# Patient Record
Sex: Female | Born: 1955
Health system: Southern US, Community
[De-identification: ages and names within clinical notes are randomized; demographics above are authoritative.]

## PROBLEM LIST (undated history)

## (undated) DIAGNOSIS — I10 Essential (primary) hypertension: Secondary | ICD-10-CM

## (undated) DIAGNOSIS — S46009A Unspecified injury of muscle(s) and tendon(s) of the rotator cuff of unspecified shoulder, initial encounter: Secondary | ICD-10-CM

## (undated) DIAGNOSIS — C801 Malignant (primary) neoplasm, unspecified: Secondary | ICD-10-CM

## (undated) DIAGNOSIS — K219 Gastro-esophageal reflux disease without esophagitis: Secondary | ICD-10-CM

## (undated) DIAGNOSIS — M1990 Unspecified osteoarthritis, unspecified site: Secondary | ICD-10-CM

## (undated) DIAGNOSIS — T7840XA Allergy, unspecified, initial encounter: Secondary | ICD-10-CM

## (undated) HISTORY — PX: JOINT REPLACEMENT: SHX530

## (undated) HISTORY — PX: ABDOMINAL HYSTERECTOMY: SHX81

## (undated) HISTORY — PX: CHOLECYSTECTOMY: SHX55

## (undated) HISTORY — PX: BREAST REDUCTION SURGERY: SHX8

## (undated) HISTORY — DX: Unspecified injury of muscle(s) and tendon(s) of the rotator cuff of unspecified shoulder, initial encounter: S46.009A

## (undated) HISTORY — PX: TUBAL LIGATION: SHX77

## (undated) HISTORY — DX: Unspecified osteoarthritis, unspecified site: M19.90

## (undated) HISTORY — DX: Gastro-esophageal reflux disease without esophagitis: K21.9

## (undated) HISTORY — DX: Allergy, unspecified, initial encounter: T78.40XA

## (undated) HISTORY — PX: FRACTURE SURGERY: SHX138

## (undated) HISTORY — DX: Malignant (primary) neoplasm, unspecified: C80.1

## (undated) HISTORY — PX: BREAST SURGERY: SHX581

## (undated) HISTORY — PX: BREAST LUMPECTOMY: SHX2

---

## 1999-05-14 ENCOUNTER — Other Ambulatory Visit: Admission: RE | Admit: 1999-05-14 | Discharge: 1999-05-14 | Payer: Self-pay | Admitting: Gynecology

## 2001-09-14 ENCOUNTER — Other Ambulatory Visit: Admission: RE | Admit: 2001-09-14 | Discharge: 2001-09-14 | Payer: Self-pay | Admitting: Family Medicine

## 2002-04-24 ENCOUNTER — Encounter: Payer: Self-pay | Admitting: Obstetrics and Gynecology

## 2002-05-01 ENCOUNTER — Observation Stay (HOSPITAL_COMMUNITY): Admission: RE | Admit: 2002-05-01 | Discharge: 2002-05-02 | Payer: Self-pay | Admitting: Obstetrics and Gynecology

## 2002-05-01 ENCOUNTER — Encounter (INDEPENDENT_AMBULATORY_CARE_PROVIDER_SITE_OTHER): Payer: Self-pay

## 2002-11-08 ENCOUNTER — Other Ambulatory Visit: Admission: RE | Admit: 2002-11-08 | Discharge: 2002-11-08 | Payer: Self-pay | Admitting: Obstetrics and Gynecology

## 2003-12-12 ENCOUNTER — Other Ambulatory Visit: Admission: RE | Admit: 2003-12-12 | Discharge: 2003-12-12 | Payer: Self-pay | Admitting: Obstetrics and Gynecology

## 2007-05-12 ENCOUNTER — Encounter: Admission: RE | Admit: 2007-05-12 | Discharge: 2007-05-12 | Payer: Self-pay | Admitting: Family Medicine

## 2007-06-15 DIAGNOSIS — C801 Malignant (primary) neoplasm, unspecified: Secondary | ICD-10-CM

## 2007-06-15 HISTORY — DX: Malignant (primary) neoplasm, unspecified: C80.1

## 2007-12-12 ENCOUNTER — Ambulatory Visit (HOSPITAL_COMMUNITY): Admission: RE | Admit: 2007-12-12 | Discharge: 2007-12-12 | Payer: Self-pay | Admitting: Family Medicine

## 2009-03-19 ENCOUNTER — Encounter (HOSPITAL_COMMUNITY): Admission: RE | Admit: 2009-03-19 | Discharge: 2009-04-18 | Payer: Self-pay

## 2009-04-19 ENCOUNTER — Encounter (HOSPITAL_COMMUNITY): Admission: RE | Admit: 2009-04-19 | Discharge: 2009-05-19 | Payer: Self-pay | Admitting: Internal Medicine

## 2010-04-29 ENCOUNTER — Encounter
Admission: RE | Admit: 2010-04-29 | Discharge: 2010-05-21 | Payer: Self-pay | Source: Home / Self Care | Attending: Internal Medicine | Admitting: Internal Medicine

## 2010-06-09 ENCOUNTER — Emergency Department (HOSPITAL_COMMUNITY)
Admission: EM | Admit: 2010-06-09 | Discharge: 2010-06-09 | Payer: Self-pay | Source: Home / Self Care | Admitting: Emergency Medicine

## 2010-07-05 ENCOUNTER — Encounter: Payer: Self-pay | Admitting: Family Medicine

## 2010-07-06 ENCOUNTER — Encounter: Payer: Self-pay | Admitting: Obstetrics and Gynecology

## 2010-10-30 NOTE — Op Note (Signed)
Erin Hurley, Erin Hurley                        ACCOUNT NO.:  [id number]   MEDICAL RECORD NO.:  [id number]                   PATIENT TYPE:  INP   LOCATION:  X004                                 FACILITY:  [facility]   PHYSICIAN:  Duke Salvia. Marcelle Hurley, M.D.            DATE OF BIRTH:  1956/04/30   DATE OF PROCEDURE:  05/01/2002  DATE OF DISCHARGE:                                 OPERATIVE REPORT   PREOPERATIVE DIAGNOSES:  Cystocele, pelvic pain, menorrhagia with leiomyoma,  stress urinary incontinence.   POSTOPERATIVE DIAGNOSES:  Cystocele, pelvic pain, menorrhagia with  leiomyoma, stress urinary incontinence.   PROCEDURE:  Laparoscopically assisted vaginal hysterectomy, anterior repair,  pubovaginal sling per Dr. Annabell Howells.   SURGEON:  Duke Salvia. Marcelle Hurley, M.D.   ASSISTANT:  Juluis Mire, M.D.   ANESTHESIA:  General endotracheal.   SPECIMENS:  Uterus and vaginal mucosa.   ESTIMATED BLOOD LOSS:  500   DESCRIPTION OF PROCEDURE:  The patient was taken to the operating room and  after an adequate level of general endotracheal anesthesia was obtained, the  patient was placed in stirrups, the abdomen, perineum, and vagina prepped  and draped in the usual manner for laparoscopic vaginal procedures. The  bladder was drained, EUA carried out, the uterus in position, normal size,  adnexa negative. A Hulka tenaculum was positioned after the bladder was  drained. Attention was directed to the abdomen where a 2 cm subumbilical  incision was made, the Veress needle was introduced without difficulty and  it's intra-abdominal position was verified by pressure and water testing.  After 2 liter pneumoperitoneum was then created, laparoscopic trocars were  then introduced. There was no evidence of any bleeding or trauma. Three  fingerbreadths below the symphysis on the midline, a 5 mm trocar was  inserted without difficulty. The uterus itself was upper limit of normal  size. There was solitary omental  adhesion to the area of the right mid tube  which was coagulated and cut. There was on bile this area. Both ovaries were  unremarkable. The previously noted right adnexal cysts have resolved  representing a transient corpus luteum cyst. The tripolar coag cutting  instrument was used to coagulate the uteroovarian pedicles including the  round ligament on either side with excellent hemostasis. After this was  completed and the vaginal portion of the procedure was started, the  patient's legs were extended, a weighted retractor positioned, cervix  grasped with a tenaculum, cervicovaginal mucosa was incised, posterior  culdotomy performed without difficulty. The bladder was advanced superiorly  with sharp and blunt dissection. The LigaSure system was used then to clamp,  coagulate and cut the uterosacral ligaments on either side. The bladder was  advanced superiorly until the peritoneum could be identified. This was  entered sharply and the retractor was used to gently position the bladder  out of the field. In sequential manner then, the cardinal ligament, uterine  vasculature, pedicle and  upper broad ligament pedicles were clamped,  coagulated and cut. The fundus of the uterus was then delivered posteriorly  and the remaining upper broad ligament pedicle was clamped and the specimen  was released. These pedicles were free tied followed by suture ligature of#0  Dexon. The cuff was then closed with running 2-0 Dexon locked suture. All  major pedicles were inspected and noted to be hemostatic. McCall culdoplasty  suture placed from 3 to 9 o'clock from the left uterosacral ligaments  picking up the posterior peritoneum across to the right side and tied down.  The cuff was closed from right to left with figure-of-eight 2-0 Monocryl  sutures. Prior to this, sponge, needle and instrument counts had been  reported as correct x2. Just above the cuff, the anterior mucosa was grasped  with Allis clamps,  divided in the midline up to the UV angle. The  paravesical fascia was then separated from underneath and plicating sutures  of 2-0 Dexon from the UV angle posterior were positioned reducing the  cystocele, excess vaginal mucosa was trimmed and this was closed with  running 2-0 Monocryl sutures. The upper portion was left open for Dr. Annabell Howells  to perform the pubovaginal sling which was dictated separately. The catheter  was positioned after this, repeat laparoscopy was performed revealing  hemostasis. Instruments were removed, gas allowed to escape, mucosa closed  with 4-0 Dexon subcuticular sutures, Dermabond and Steri-Strips. She went to  the recovery room in good condition.                                               Erin Hurley, M.D.    RMH/MEDQ  D:  05/01/2002  T:  05/01/2002  Job:  [id number]

## 2010-10-30 NOTE — H&P (Signed)
Erin Hurley, Erin Hurley                        ACCOUNT NO.:  [id number]   MEDICAL RECORD NO.:  [id number]                   PATIENT TYPE:  INP   LOCATION:  X004                                 FACILITY:  [facility]   PHYSICIAN:  Duke Salvia. Marcelle Overlie, M.D.            DATE OF BIRTH:  09/03/1955   DATE OF ADMISSION:  05/01/2002  DATE OF DISCHARGE:                                HISTORY & PHYSICAL   CHIEF COMPLAINT:  Stress urinary incontinence, pelvic pressure, leiomyoma  and cystocele.   HISTORY OF PRESENT ILLNESS:  A 55 year old, G2, P2 patient with a prior  tubal was referred to me by Dr. Bjorn Pippin in October. He had evaluated her  for incontinence and I have recommended a sling. When seen in our office,  she was complaining about increased pelvic pressure and the sensation that  something was falling out. On my exam, the uterus was 6-8 week size and was  irregular. Exam consistent with 6-8 week leiomyoma.   She underwent an ultrasound in our office that showed uterine size 6 1/2 x  11 x 5 1/2 with possibility of polyps and also small leiomyoma. There was a  3.7 x 3.6 x 3.3 cm ovarian mass noted at that time possibly representing a  complex corpus luteum cyst or an endometrioma. No free fluid noted. Due to  her complaint secondary to the pressure from the leiomyoma and pelvic pain,  she presents now for LAVH, possible oophorectomy, cystocele repair and sling  for Dr. Annabell Howells. This procedure including risk of bleeding, infection,  transfusion, adjacent organ injury, the possible need for open or additional  surgery are all reviewed with her. Along with her expected recovery time.   PAST MEDICAL HISTORY:  Allergies none.   OBSTETRICAL HISTORY:  Two vaginal deliveries without complications.   PAST SURGICAL HISTORY:  Prior tubal ligation.   REVIEW OF SYMPTOMS:  Otherwise unremarkable.   FAMILY HISTORY:  Mother and father both had a history of heart disease and  father with  hypertension.   SOCIAL HISTORY:  She denies smoking or alcohol use.   PHYSICAL EXAMINATION:  VITAL SIGNS:  Temperature 98.2, blood pressure  120/82.   HEENT:  Unremarkable.   NECK:  Supple without masses.   LUNGS:  Clear.   CARDIOVASCULAR:  Regular rate and rhythm without murmurs, rubs or gallops.   BREASTS:  Without masses.   ABDOMEN:  Soft, flat, nontender.   PELVIC:  Normal external genitalia. Vagina and cervix clear. Uterus was 6  weeks size, slightly irregular at the fundus. Adnexa negative. On straining,  there was mild descensus and a cystocele was noted. Her posterior support  was good.   EXTREMITIES:  Unremarkable.   NEUROLOGIC:  Unremarkable.   IMPRESSION:  1. Symptomatic leiomyoma.  2. History of pelvic pain with ultrasound showing possible endometrioma     versus corpus luteum cyst.  3. Stress urinary incontinence.   PLAN:  Laparoscopically assisted vaginal hysterectomy with possible  oophorectomy, anterior colporrhaphy and pubovaginal sling per Dr. Annabell Howells. The  procedure and risks were reviewed as above.                                               Richard M. Marcelle Overlie, M.D.    RMH/MEDQ  D:  05/01/2002  T:  05/01/2002  Job:  [id number]

## 2011-04-03 DIAGNOSIS — Z96642 Presence of left artificial hip joint: Secondary | ICD-10-CM | POA: Insufficient documentation

## 2011-04-03 DIAGNOSIS — M169 Osteoarthritis of hip, unspecified: Secondary | ICD-10-CM | POA: Insufficient documentation

## 2011-04-03 DIAGNOSIS — C50919 Malignant neoplasm of unspecified site of unspecified female breast: Secondary | ICD-10-CM | POA: Insufficient documentation

## 2011-04-03 DIAGNOSIS — K219 Gastro-esophageal reflux disease without esophagitis: Secondary | ICD-10-CM | POA: Insufficient documentation

## 2011-04-03 DIAGNOSIS — B351 Tinea unguium: Secondary | ICD-10-CM | POA: Insufficient documentation

## 2011-04-09 DIAGNOSIS — M8580 Other specified disorders of bone density and structure, unspecified site: Secondary | ICD-10-CM | POA: Insufficient documentation

## 2011-08-06 DIAGNOSIS — Z9889 Other specified postprocedural states: Secondary | ICD-10-CM | POA: Insufficient documentation

## 2014-01-21 ENCOUNTER — Encounter: Payer: Self-pay | Admitting: Nurse Practitioner

## 2014-01-21 ENCOUNTER — Ambulatory Visit (INDEPENDENT_AMBULATORY_CARE_PROVIDER_SITE_OTHER): Payer: BC Managed Care – PPO | Admitting: Nurse Practitioner

## 2014-01-21 ENCOUNTER — Encounter (INDEPENDENT_AMBULATORY_CARE_PROVIDER_SITE_OTHER): Payer: Self-pay

## 2014-01-21 VITALS — BP 135/76 | HR 117 | Temp 100.2°F | Ht 65.0 in | Wt 207.0 lb

## 2014-01-21 DIAGNOSIS — L0233 Carbuncle of buttock: Secondary | ICD-10-CM

## 2014-01-21 MED ORDER — CEFTRIAXONE SODIUM 1 G IJ SOLR
1.0000 g | Freq: Once | INTRAMUSCULAR | Status: AC
  Administered 2014-01-21: 1 g via INTRAMUSCULAR

## 2014-01-21 MED ORDER — SULFAMETHOXAZOLE-TMP DS 800-160 MG PO TABS: 1.0000 | ORAL_TABLET | Freq: Two times a day (BID) | ORAL | Status: DC

## 2014-01-21 NOTE — Progress Notes (Signed)
   Subjective:    Patient ID: Erin Hurley, female    DOB: 1955-06-29, 58 y.o.   MRN: [id number]  HPI Patient in c/o boil in groin area. Found it on Saturday and has gotten worse- drained some last night.    Review of Systems  Respiratory: Negative.   Cardiovascular: Negative.   Genitourinary: Negative.   Neurological: Negative.   Psychiatric/Behavioral: Negative.   All other systems reviewed and are negative.      Objective:   Physical Exam  Constitutional: She is oriented to person, place, and time. She appears well-developed and well-nourished.  Cardiovascular: Normal rate, regular rhythm and normal heart sounds.   Pulmonary/Chest: Effort normal and breath sounds normal.  Neurological: She is alert and oriented to person, place, and time.  Skin: Skin is warm and dry.  10 cm erythematous indurated area on left perineum and buttocks.  Psychiatric: She has a normal mood and affect. Her behavior is normal. Judgment and thought content normal.   BP 135/76  Pulse 117  Temp(Src) 100.2 F (37.9 C) (Oral)  Ht 5\' 5"  (1.651 m)  Wt 207 lb (93.895 kg)  BMI 34.45 kg/m2        Assessment & Plan:   1. Carbuncle of buttock    Meds ordered this encounter  Medications  . letrozole (FEMARA) 2.5 MG tablet    Sig: Take 2.5 mg by mouth daily.  . Vitamin D, Ergocalciferol, (DRISDOL) 50000 UNITS CAPS capsule    Sig: Take 50,000 Units by mouth every 7 (seven) days.   Warm soaks If starts draining and can squeeze pus out that is fine If not draining and clearing up by Electra, FNP

## 2014-01-21 NOTE — Patient Instructions (Signed)

## 2014-07-31 ENCOUNTER — Ambulatory Visit: Payer: Self-pay | Admitting: Family

## 2014-08-09 ENCOUNTER — Other Ambulatory Visit: Payer: Self-pay | Admitting: Physician Assistant

## 2014-08-09 ENCOUNTER — Encounter: Payer: Self-pay | Admitting: Physician Assistant

## 2014-08-09 ENCOUNTER — Encounter (INDEPENDENT_AMBULATORY_CARE_PROVIDER_SITE_OTHER): Payer: Self-pay

## 2014-08-09 ENCOUNTER — Ambulatory Visit (INDEPENDENT_AMBULATORY_CARE_PROVIDER_SITE_OTHER): Payer: BLUE CROSS/BLUE SHIELD | Admitting: Physician Assistant

## 2014-08-09 ENCOUNTER — Ambulatory Visit (INDEPENDENT_AMBULATORY_CARE_PROVIDER_SITE_OTHER): Payer: BLUE CROSS/BLUE SHIELD

## 2014-08-09 ENCOUNTER — Ambulatory Visit: Admission: RE | Admit: 2014-08-09 | Payer: Self-pay | Source: Ambulatory Visit

## 2014-08-09 VITALS — BP 160/100 | HR 102 | Temp 98.6°F | Ht 65.0 in | Wt 206.0 lb

## 2014-08-09 DIAGNOSIS — M25561 Pain in right knee: Secondary | ICD-10-CM

## 2014-08-09 DIAGNOSIS — R52 Pain, unspecified: Secondary | ICD-10-CM

## 2014-08-09 MED ORDER — MELOXICAM 7.5 MG PO TABS: 7.5000 mg | ORAL_TABLET | Freq: Every day | ORAL | Status: DC

## 2014-08-09 NOTE — Progress Notes (Signed)
Subjective:     Patient ID: Erin Hurley, female   DOB: Oct 20, 1955, 59 y.o.   MRN: [id number]  HPI 59 y/o female presents with c/o right knee pain x 2 weeks that began suddenly when she sat down and tucked her knee under her other leg in the chair. She does not recall popping but had immediate pain in the knee and surrounding area. Has been taking Aleve , 2 tabs BID with mild relief.   Review of Systems  Musculoskeletal: Positive for joint swelling and arthralgias (right knee).  Neurological: Negative for weakness and numbness.       Objective:   Physical Exam  Musculoskeletal: Normal range of motion. She exhibits edema (RLE, localized to knee and surrounding area) and tenderness (mild TTP medial patella).  Skin: No erythema.  Palpable inflammation posterior right knee resembling Bakers Cyst       Assessment:    1. Right knee pain - xray negative for fracture or dislocation 2. Bakers Cyst secondary to knee inflammation/injury     Plan:    Rx Mobic 7.5 mg q day x 7 days. Increase to two tablets q day if no improvement after 1 week. Use knee brace for compression. Rest, Ice and elevate. F/u in 2-3 weeks for reassessment and will order MRI if no improvement.

## 2014-08-09 NOTE — Patient Instructions (Signed)
Baker Cyst A Baker cyst is a sac-like structure that forms in the back of the knee. It is filled with the same fluid that is located in your knee. This fluid lubricates the bones and cartilage of the knee and allows them to move over each other more easily. CAUSES  When the knee becomes injured or inflamed, increased fluid forms in the knee. When this happens, the joint lining is pushed out behind the knee and forms the Baker cyst. This cyst may also be caused by inflammation from arthritic conditions and infections. SIGNS AND SYMPTOMS  A Baker cyst usually has no symptoms. When the cyst is substantially enlarged:  You may feel pressure behind the knee, stiffness in the knee, or a mass in the area behind the knee.  You may develop pain, redness, and swelling in the calf. This can suggest a blood clot and requires evaluation by your health care provider. DIAGNOSIS  A Baker cyst is most often found during an ultrasound exam. This exam may have been performed for other reasons, and the cyst was found incidentally. Sometimes an MRI is used. This picks up other problems within a joint that an ultrasound exam may not. If the Baker cyst developed immediately after an injury, X-ray exams may be used to diagnose the cyst. TREATMENT  The treatment depends on the cause of the cyst. Anti-inflammatory medicines and rest often will be prescribed. If the cyst is caused by a bacterial infection, antibiotic medicines may be prescribed.  HOME CARE INSTRUCTIONS   If the cyst was caused by an injury, for the first 24 hours, keep the injured leg elevated on 2 pillows while lying down.  For the first 24 hours while you are awake, apply ice to the injured area:  Put ice in a plastic bag.  Place a towel between your skin and the bag.  Leave the ice on for 20 minutes, 2-3 times a day.  Only take over-the-counter or prescription medicines for pain, discomfort, or fever as directed by your health care  provider.  Only take antibiotic medicine as directed. Make sure to finish it even if you start to feel better. MAKE SURE YOU:   Understand these instructions.  Will watch your condition.  Will get help right away if you are not doing well or get worse. Document Released: 05/31/2005 Document Revised: 03/21/2013 Document Reviewed: 01/10/2013 [facility] Patient Information 2015 [place], [place]. This information is not intended to replace advice given to you by your health care provider. Make sure you discuss any questions you have with your health care provider. Knee Bracing Knee braces are supports to help stabilize and protect an injured or painful knee. They come in many different styles. They should support and protect the knee without increasing the chance of other injuries to yourself or others. It is important not to have a false sense of security when using a brace. Knee braces that help you to keep using your knee:  Do not restore normal knee stability under high stress forces.  May decrease some aspects of athletic performance. Some of the different types of knee braces are:  Prophylactic knee braces are designed to prevent or reduce the severity of knee injuries during sports that make injury to the knee more likely.  Rehabilitative knee braces are designed to allow protected motion of:  Injured knees.  Knees that have been treated with or without surgery. There is no evidence that the use of a supportive knee brace protects the graft following  a successful anterior cruciate ligament (ACL) reconstruction. However, braces are sometimes used to:   Protect injured ligaments.  Control knee movement during the initial healing period. They may be used as part of the treatment program for the various injured ligaments or cartilage of the knee including the:  Anterior cruciate ligament.  Medial collateral ligament.  Medial or lateral cartilage (meniscus).  Posterior cruciate  ligament.  Lateral collateral ligament. Rehabilitative knee braces are most commonly used:  During crutch-assisted walking right after injury.  During crutch-assisted walking right after surgery to repair the cartilage and/or cruciate ligament injury.  For a short period of time, 2-8 weeks, after the injury or surgery. The value of a rehabilitative brace as opposed to a cast or splint includes the:  Ability to adjust the brace for swelling.  Ability to remove the brace for examinations, icing, or showering.  Ability to allow for movement in a controlled range of motion. Functional knee braces give support to knees that have already been injured. They are designed to provide stability for the injured knee and provide protection after repair. Functional knee braces may not affect performance much. Lower extremity muscle strengthening, flexibility, and improvement in technique are more important than bracing in treating ligamentous knee injuries. Functional braces are not a substitute for rehabilitation or surgical procedures. Unloader/off-loader braces are designed to provide pain relief in arthritic knees. Patients with wear and tear arthritis from growing old or from an old cartilage injury (osteoarthritis) of the knee, and bowlegged (varus) or knock-knee (valgus) deformities, often develop increased pain in the arthritic side due to increased loading. Unloader/off-loader braces are made to reduce uneven loading in such knees. There is reduction in bowing out movement in bowlegged knees when the correct unloader brace is used. Patients with advanced osteoarthritis or severe varus or valgus alignment problems would not likely benefit from bracing. Patellofemoral braces help the kneecap to move smoothly and well centered over the end of the femur in the knee.  Most people who wear knee braces feel that they help. However, there is a lack of scientific evidence that knee braces are helpful at the  level needed for athletic participation to prevent injury. In spite of this, athletes report an increase in knee stability, pain relief, performance improvement, and confidence during athletics when using a brace.  Different knee problems require different knee braces:  Your caregiver may suggest one kind of knee brace after knee surgery.  A caregiver may choose another kind of knee brace for support instead of surgery for some types of torn ligaments.  You may also need one for pain in the front of your knee that is not getting better with strengthening and flexibility exercises. Get your caregiver's advice if you want to try a knee brace. The caregiver will advise you on where to get them and provide a prescription when it is needed to fashion and/or fit the brace. Knee braces are the least important part of preventing knee injuries or getting better following injury. Stretching, strengthening and technique improvement are far more important in caring for and preventing knee injuries. When strengthening your knee, increase your activities a little at a time so as not to develop injuries from overuse. Work out an exercise plan with your caregiver and/or physical therapist to get the best program for you. Do not let a knee brace become a crutch. Always remember, there are no braces which support the knee as well as your original ligaments and cartilage you were born  with. Conditioning, proper warm-up, and stretching remain the most important parts of keeping your knees healthy. HOW TO USE A KNEE BRACE  During sports, knee braces should be used as directed by your caregiver.  Make sure that the hinges are where the knee bends.  Straps, tapes, or hook-and-loop tapes should be fastened around your leg as instructed.  You should check the placement of the brace during activities to make sure that it has not moved. Poorly positioned braces can hurt rather than help you.  To work well, a knee brace  should be worn during all activities that put you at risk of knee injury.  Warm up properly before beginning athletic activities. HOME CARE INSTRUCTIONS  Knee braces often get damaged during normal use. Replace worn-out braces for maximum benefit.  Clean regularly with soap and water.  Inspect your brace often for wear and tear.  Cover exposed metal to protect others from injury.  Durable materials may cost more, but last longer. SEEK IMMEDIATE MEDICAL CARE IF:   Your knee seems to be getting worse rather than better.  You have increasing pain or swelling in the knee.  You have problems caused by the knee brace.  You have increased swelling or inflammation (redness or soreness) in your knee.  Your knee becomes warm and more painful and you develop an unexplained temperature over 101F (38.3C). MAKE SURE YOU:   Understand these instructions.  Will watch your condition.  Will get help right away if you are not doing well or get worse. See your caregiver, physical therapist, or orthopedic surgeon for additional information. Document Released: 08/21/2003 Document Revised: 10/15/2013 Document Reviewed: 11/27/2008 [facility] Patient Information 2015 [place], [place]. This information is not intended to replace advice given to you by your health care provider. Make sure you discuss any questions you have with your health care provider.  Knee Pain Knee pain can be a result of an injury or other medical conditions. Treatment will depend on the cause of your pain. HOME CARE  Only take medicine as told by your doctor.  Keep a healthy weight. Being overweight can make the knee hurt more.  Stretch before exercising or playing sports.  If there is constant knee pain, change the way you exercise. Ask your doctor for advice.  Make sure shoes fit well. Choose the right shoe for the sport or activity.  Protect your knees. Wear kneepads if needed.  Rest when you are tired. GET HELP  RIGHT AWAY IF:   Your knee pain does not stop.  Your knee pain does not get better.  Your knee joint feels hot to the touch.  You have a fever. MAKE SURE YOU:   Understand these instructions.  Will watch this condition.  Will get help right away if you are not doing well or get worse. Document Released: 08/27/2008 Document Revised: 08/23/2011 Document Reviewed: 08/27/2008 [facility] Patient Information 2015 [place], [place]. This information is not intended to replace advice given to you by your health care provider. Make sure you discuss any questions you have with your health care provider.

## 2014-08-15 ENCOUNTER — Telehealth: Payer: Self-pay | Admitting: *Deleted

## 2014-08-15 NOTE — Telephone Encounter (Signed)
Left message on voicemail that knee xray was normal, to see if Mobic was helping & to call back if need a f/u appt.

## 2014-08-30 ENCOUNTER — Ambulatory Visit: Payer: BLUE CROSS/BLUE SHIELD | Admitting: Physician Assistant

## 2014-09-20 ENCOUNTER — Encounter: Payer: Self-pay | Admitting: Nurse Practitioner

## 2014-09-20 ENCOUNTER — Ambulatory Visit (INDEPENDENT_AMBULATORY_CARE_PROVIDER_SITE_OTHER): Payer: BLUE CROSS/BLUE SHIELD | Admitting: Nurse Practitioner

## 2014-09-20 VITALS — BP 123/81 | HR 122 | Temp 98.3°F | Ht 65.0 in | Wt 203.0 lb

## 2014-09-20 DIAGNOSIS — J209 Acute bronchitis, unspecified: Secondary | ICD-10-CM | POA: Diagnosis not present

## 2014-09-20 DIAGNOSIS — R1110 Vomiting, unspecified: Secondary | ICD-10-CM

## 2014-09-20 MED ORDER — ONDANSETRON HCL 4 MG PO TABS: 4.0000 mg | ORAL_TABLET | Freq: Three times a day (TID) | ORAL | Status: DC | PRN

## 2014-09-20 MED ORDER — PROMETHAZINE HCL 25 MG/ML IJ SOLN: 12.5000 mg | Freq: Once | INTRAMUSCULAR | Status: DC

## 2014-09-20 MED ORDER — PROMETHAZINE HCL 25 MG/ML IJ SOLN
12.5000 mg | Freq: Once | INTRAMUSCULAR | Status: AC
  Administered 2014-09-20: 12.5 mg via INTRAMUSCULAR

## 2014-09-20 MED ORDER — AZITHROMYCIN 250 MG PO TABS: ORAL_TABLET | ORAL | Status: DC

## 2014-09-20 NOTE — Patient Instructions (Signed)
Bronchitis  1. Take meds as prescribed 2. Use a cool mist humidifier especially during the winter months and when heat has been humid. 3. Use saline nose sprays frequently 4. Saline irrigations of the nose can be very helpful if done frequently.  * 4X daily for 1 week*  * Use of a nettie pot can be helpful with this. Follow directions with this* 5. Drink plenty of fluids 6. Keep thermostat turn down low 7.For any cough or congestion  Use plain Mucinex- regular strength or max strength is fine   * Children- consult with Pharmacist for dosing 8. For fever or aces or pains- take tylenol or ibuprofen appropriate for age and weight.  * for fevers greater than 101 orally you may alternate ibuprofen and tylenol every  3 hours.   Nausea and vomiting  First 24 Hours-Clear liquids  popsicles  Jello  gatorade  Sprite Second 24 hours-Add Full liquids ( Liquids you cant see through) Third 24 hours- Bland diet ( foods that are baked or broiled)  *avoiding fried foods and highly spiced foods* During these 3 days  Avoid milk, cheese, ice cream or any other dairy products  Avoid caffeine- REMEMBER Mt. Dew and Mello Yellow contain lots of caffeine You should eat and drink in  Frequent small volumes If no improvement in symptoms or worsen in 2-3 days should RETRUN TO OFFICE or go to ER!

## 2014-09-20 NOTE — Addendum Note (Signed)
Addended by: Chevis Pretty on: 09/20/2014 04:49 PM   Modules accepted: Orders

## 2014-09-20 NOTE — Addendum Note (Signed)
Addended by: Rolena Infante on: 09/20/2014 05:28 PM   Modules accepted: Orders, Medications

## 2014-09-20 NOTE — Progress Notes (Addendum)
  Subjective:     Erin Hurley is a 59 y.o. female here for evaluation of a cough. Onset of symptoms was 2 days ago. Symptoms have been gradually worsening since that time. The cough is barky and productive of yellow and green sputum and is aggravated by nothing. Associated symptoms include: change in voice, chills, fever and sputum production. Patient does not have a history of asthma. Patient does have a history of environmental allergens. Patient has not traveled recently. Patient does not have a history of smoking. Patient has not had a previous chest x-ray. Patient has not had a PPD done.  The following portions of the patient's history were reviewed and updated as appropriate: allergies, current medications, past family history, past medical history, past social history, past surgical history and problem list.  Review of Systems Pertinent items are noted in HPI.    Objective:     BP 123/81 mmHg  Pulse 122  Temp(Src) 98.3 F (36.8 C) (Oral)  Ht 5\' 5"  (1.651 m)  Wt 203 lb (92.08 kg)  BMI 33.78 kg/m2 General appearance: alert and cooperative Eyes: conjunctivae/corneas clear. PERRL, EOM's intact. Fundi benign. Ears: normal TM's and external ear canals both ears Nose: Nares normal. Septum midline. Mucosa normal. No drainage or sinus tenderness., green discharge, moderate congestion, turbinates red, no sinus tenderness Throat: lips, mucosa, and tongue normal; teeth and gums normal Lungs: clear to auscultation bilaterally Heart: regular rate and rhythm, S1, S2 normal, no murmur, click, rub or gallop    Assessment:    Acute Bronchitis   nausea and vmiting Plan:    1. Acute bronchitis, unspecified organism 1. Take meds as prescribed 2. Use a cool mist humidifier especially during the winter months and when heat has been humid. 3. Use saline nose sprays frequently 4. Saline irrigations of the nose can be very helpful if done frequently.  * 4X daily for 1 week*  * Use of a nettie  pot can be helpful with this. Follow directions with this* 5. Drink plenty of fluids 6. Keep thermostat turn down low 7.For any cough or congestion  Use plain Mucinex- regular strength or max strength is fine   * Children- consult with Pharmacist for dosing 8. For fever or aces or pains- take tylenol or ibuprofen appropriate for age and weight.  * for fevers greater than 101 orally you may alternate ibuprofen and tylenol every  3 hours.    - azithromycin (ZITHROMAX Z-PAK) 250 MG tablet; As directed  Dispense: 1 each; Refill: 0  2. Intractable vomiting with nausea, vomiting of unspecified type First 24 Hours-Clear liquids  popsicles  Jello  gatorade  Sprite Second 24 hours-Add Full liquids ( Liquids you cant see through) Third 24 hours- Bland diet ( foods that are baked or broiled)  *avoiding fried foods and highly spiced foods* During these 3 days  Avoid milk, cheese, ice cream or any other dairy products  Avoid caffeine- REMEMBER Mt. Dew and Mello Yellow contain lots of caffeine You should eat and drink in  Frequent small volumes If no improvement in symptoms or worsen in 2-3 days should RETRUN TO OFFICE or go to ER!   Phenergan IM now- 12.5mg  Im now. - ondansetron (ZOFRAN) 4 MG tablet; Take 1 tablet (4 mg total) by mouth every 8 (eight) hours as needed for nausea or vomiting.  Dispense: 20 tablet; Refill: 0  Mary-Margaret Hassell Done, FNP

## 2014-09-24 ENCOUNTER — Telehealth: Payer: Self-pay | Admitting: Nurse Practitioner

## 2014-09-24 MED ORDER — BENZONATATE 100 MG PO CAPS: 100.0000 mg | ORAL_CAPSULE | Freq: Three times a day (TID) | ORAL | Status: DC | PRN

## 2014-12-26 DIAGNOSIS — M1711 Unilateral primary osteoarthritis, right knee: Secondary | ICD-10-CM | POA: Insufficient documentation

## 2015-06-18 DIAGNOSIS — R0602 Shortness of breath: Secondary | ICD-10-CM | POA: Insufficient documentation

## 2015-06-19 DIAGNOSIS — I5189 Other ill-defined heart diseases: Secondary | ICD-10-CM | POA: Insufficient documentation

## 2015-12-29 ENCOUNTER — Encounter: Payer: Self-pay | Admitting: Pediatrics

## 2015-12-29 ENCOUNTER — Ambulatory Visit (INDEPENDENT_AMBULATORY_CARE_PROVIDER_SITE_OTHER): Payer: BLUE CROSS/BLUE SHIELD | Admitting: Pediatrics

## 2015-12-29 VITALS — BP 154/106 | HR 95 | Temp 98.6°F | Ht 65.0 in | Wt 213.6 lb

## 2015-12-29 DIAGNOSIS — M25561 Pain in right knee: Secondary | ICD-10-CM | POA: Insufficient documentation

## 2015-12-29 DIAGNOSIS — Z6835 Body mass index (BMI) 35.0-35.9, adult: Secondary | ICD-10-CM

## 2015-12-29 DIAGNOSIS — S46211A Strain of muscle, fascia and tendon of other parts of biceps, right arm, initial encounter: Secondary | ICD-10-CM

## 2015-12-29 DIAGNOSIS — I10 Essential (primary) hypertension: Secondary | ICD-10-CM | POA: Insufficient documentation

## 2015-12-29 DIAGNOSIS — S46219A Strain of muscle, fascia and tendon of other parts of biceps, unspecified arm, initial encounter: Secondary | ICD-10-CM | POA: Insufficient documentation

## 2015-12-29 MED ORDER — MELOXICAM 7.5 MG PO TABS: 7.5000 mg | ORAL_TABLET | Freq: Every day | ORAL | Status: DC

## 2015-12-29 MED ORDER — LISINOPRIL 10 MG PO TABS
10.0000 mg | ORAL_TABLET | Freq: Every day | ORAL | Status: DC
Start: 2015-12-29 — End: 2017-05-31

## 2015-12-29 NOTE — Addendum Note (Signed)
Addended by: Eustaquio Maize on: 12/29/2015 08:32 PM   Modules accepted: Level of Service

## 2015-12-29 NOTE — Progress Notes (Signed)
    Subjective:    Patient ID: Erin Hurley, female    DOB: 07-21-1955, 60 y.o.   MRN: [id number]  CC: Arm Pain   HPI: Erin Hurley is a 60 y.o. female presenting for Arm Pain  4 days ago pt tripped, reached out her L arm to catch herself and badly "wrenched" her L arm behind her while trying to catch herself Didn't feel anything pop but felt like arm "ripped". Has been sore since then, arm is sore ant shoulder Has had bulge of muscle above elbow Is able to bend arm Has normal ROM shoulder though sore Has full ROM at elbow but hurts from 180 to 90 degrees, feels stronger 90 to 30 degrees Never hurt this arm before Didn't hurt anything else during fall  Takes meloxicam for knee pain Has been helping, would like a refill  Stopped taking her lisinopril Had no problems while on it, no symptoms Now no headaches, vision changes, no SOB or chest pain Has been feeling well   Depression screen [facility] 2/9 12/29/2015 08/09/2014  Decreased Interest 0 0  Down, Depressed, Hopeless 0 1  PHQ - 2 Score 0 1     Relevant past medical, surgical, family and social history reviewed and updated as indicated.  Interim medical history since our last visit reviewed. Allergies and medications reviewed and updated.  ROS: Per HPI unless specifically indicated above  History  Smoking status  . Never Smoker   Smokeless tobacco  . Not on file       Objective:    BP 154/106 mmHg  Pulse 95  Temp(Src) 98.6 F (37 C) (Oral)  Ht 5\' 5"  (1.651 m)  Wt 213 lb 9.6 oz (96.888 kg)  BMI 35.54 kg/m2  Wt Readings from Last 3 Encounters:  12/29/15 213 lb 9.6 oz (96.888 kg)  09/20/14 203 lb (92.08 kg)  08/09/14 206 lb (93.441 kg)     Gen: NAD, alert, cooperative with exam, NCAT EYES: EOMI, no scleral injection or icterus CV: NRRR, normal S1/S2, distal pulses 2+ b/l Resp: CTABL, no wheezes, normal WOB Neuro: Alert and oriented MSK: R arm with muscle bulge at bicep compared with L. Tender over  bulge. Tender with palpation biceps groove. ROM with no resistance R arm normal, pain ROM with resistance 180 to 90 degrees, stronger 90 degrees to 30 degrees.     Assessment & Plan:    Yeymi was seen today for arm pain.  Diagnoses and all orders for this visit:  Rupture of biceps tendon, traumatic, right, initial encounter Rest, ice, NSAIDs -     Ambulatory referral to Sports Medicine  Right knee pain -     meloxicam (MOBIC) 7.5 MG tablet; Take 1 tablet (7.5 mg total) by mouth daily. Increase to 2 tablets by mouth daily if no improvement after 1 week.  HTN Uncontrolled Restart below -     lisinopril (PRINIVIL,ZESTRIL) 10 MG tablet; Take 1 tablet (10 mg total) by mouth daily.   Follow up plan: Return in about 4 weeks (around 01/26/2016). Labs next visit  Assunta Found, MD [facility] 12/29/2015, 4:32 PM

## 2015-12-31 ENCOUNTER — Ambulatory Visit: Payer: Self-pay | Admitting: Family Medicine

## 2016-01-28 ENCOUNTER — Ambulatory Visit: Payer: BLUE CROSS/BLUE SHIELD | Admitting: Pediatrics

## 2016-04-02 ENCOUNTER — Encounter: Payer: Self-pay | Admitting: Pediatrics

## 2016-04-02 ENCOUNTER — Ambulatory Visit (INDEPENDENT_AMBULATORY_CARE_PROVIDER_SITE_OTHER): Payer: BLUE CROSS/BLUE SHIELD | Admitting: Pediatrics

## 2016-04-02 VITALS — BP 130/80 | HR 99 | Temp 98.8°F | Ht 65.0 in | Wt 214.2 lb

## 2016-04-02 DIAGNOSIS — D492 Neoplasm of unspecified behavior of bone, soft tissue, and skin: Secondary | ICD-10-CM | POA: Diagnosis not present

## 2016-04-02 NOTE — Progress Notes (Signed)
  Subjective:   Patient ID: Erin Hurley, female    DOB: [birth year], 58 y.o.   MRN: [id number] CC: Spot left ear  HPI: Erin Hurley is a 60 y.o. female presenting for Spot left ear  On outer ear helix Has been there several weeks Started as a scratch she thinks Itches and bleeds sometimes Mostly itching now No other skin areas or rash  Relevant past medical, surgical, family and social history reviewed. Allergies and medications reviewed and updated. History  Smoking Status  . Never Smoker  Smokeless Tobacco  . Never Used   ROS: Per HPI   Objective:    BP 130/80   Pulse 99   Temp 98.8 F (37.1 C) (Oral)   Ht 5\' 5"  (1.651 m)   Wt 214 lb 3.2 oz (97.2 kg)   BMI 35.64 kg/m   Wt Readings from Last 3 Encounters:  04/02/16 214 lb 3.2 oz (97.2 kg)  12/29/15 213 lb 9.6 oz (96.9 kg)  09/20/14 203 lb (92.1 kg)    Gen: NAD, alert, cooperative with exam, NCAT EYES: EOMI, no conjunctival injection, or no icterus Neuro: Alert and oriented Skin: 3-67mm rough topped flesh colored papule on L outer ear helix. No crusting or bleeding  Assessment & Plan:  Jamilla was seen today for spot left ear.  Diagnoses and all orders for this visit:  Skin neoplasm Concern for [facility], given location will refer to dermatology, prefers [place] location -     Ambulatory referral to Dermatology   Follow up plan: Prn or for next regularly scheduled check up Assunta Found, MD [place]

## 2016-07-01 ENCOUNTER — Emergency Department (HOSPITAL_COMMUNITY): Payer: Worker's Compensation

## 2016-07-01 ENCOUNTER — Encounter (HOSPITAL_COMMUNITY): Payer: Self-pay | Admitting: *Deleted

## 2016-07-01 ENCOUNTER — Emergency Department (HOSPITAL_COMMUNITY)
Admission: EM | Admit: 2016-07-01 | Discharge: 2016-07-01 | Disposition: A | Discharge status: Home / Self Care | Payer: Worker's Compensation | Attending: Emergency Medicine | Admitting: Emergency Medicine

## 2016-07-01 DIAGNOSIS — Z79899 Other long term (current) drug therapy: Secondary | ICD-10-CM | POA: Insufficient documentation

## 2016-07-01 DIAGNOSIS — S52612A Displaced fracture of left ulna styloid process, initial encounter for closed fracture: Secondary | ICD-10-CM | POA: Diagnosis not present

## 2016-07-01 DIAGNOSIS — S0990XA Unspecified injury of head, initial encounter: Secondary | ICD-10-CM | POA: Diagnosis not present

## 2016-07-01 DIAGNOSIS — Z853 Personal history of malignant neoplasm of breast: Secondary | ICD-10-CM | POA: Diagnosis not present

## 2016-07-01 DIAGNOSIS — S6992XA Unspecified injury of left wrist, hand and finger(s), initial encounter: Secondary | ICD-10-CM | POA: Diagnosis present

## 2016-07-01 DIAGNOSIS — Y990 Civilian activity done for income or pay: Secondary | ICD-10-CM | POA: Diagnosis not present

## 2016-07-01 DIAGNOSIS — S52502A Unspecified fracture of the lower end of left radius, initial encounter for closed fracture: Secondary | ICD-10-CM | POA: Diagnosis not present

## 2016-07-01 DIAGNOSIS — I10 Essential (primary) hypertension: Secondary | ICD-10-CM | POA: Diagnosis not present

## 2016-07-01 DIAGNOSIS — Y9389 Activity, other specified: Secondary | ICD-10-CM | POA: Insufficient documentation

## 2016-07-01 DIAGNOSIS — Y929 Unspecified place or not applicable: Secondary | ICD-10-CM | POA: Diagnosis not present

## 2016-07-01 DIAGNOSIS — W01198A Fall on same level from slipping, tripping and stumbling with subsequent striking against other object, initial encounter: Secondary | ICD-10-CM | POA: Insufficient documentation

## 2016-07-01 HISTORY — DX: Essential (primary) hypertension: I10

## 2016-07-01 NOTE — Progress Notes (Signed)
Reviewing chart and patient images per Dr. Tawni Levy request.

## 2016-07-01 NOTE — ED Provider Notes (Signed)
[place] DEPT Provider Note   CSN: [id number] Arrival date & time: 07/01/16  [id number]     History   Chief Complaint No chief complaint on file.   HPI Erin Hurley is a 61 y.o. female.  HPI  61 y.o. female presents to the Emergency Department today complaining of s/p mechanical fall on ice. Pt states that she slipped and landed on her buttocks as well as on left wrist. Rates pain 8/10. Feels like aching sensation. Has not tried any OTC remedies PTA. Applied ice. Notes head trauma by striking pipe on back of the head. No LOC. Notes dizziness and nausea. No visual changes. No headaches. No numbness/tingling. No CP/SOB/ABD pain. Pt does not take blood thinners. No other symptoms noted.    Past Medical History:  Diagnosis Date  . Cancer [facility]) 2009   breast    Patient Active Problem List   Diagnosis Date Noted  . Essential hypertension 12/29/2015  . BMI 35.0-35.9,adult 12/29/2015  . Right knee pain 12/29/2015  . Rupture of biceps tendon, traumatic 12/29/2015    Past Surgical History:  Procedure Laterality Date  . ABDOMINAL HYSTERECTOMY    . BREAST SURGERY    . JOINT REPLACEMENT Left    hip    OB History    No data available       Home Medications    Prior to Admission medications   Medication Sig Start Date End Date Taking? Authorizing Provider  esomeprazole (NEXIUM) 40 MG capsule Take by mouth.    Historical Provider, MD  letrozole (FEMARA) 2.5 MG tablet Take 2.5 mg by mouth daily.    Historical Provider, MD  lisinopril (PRINIVIL,ZESTRIL) 10 MG tablet Take 1 tablet (10 mg total) by mouth daily. 12/29/15   Eustaquio Maize, MD    Family History Family History  Problem Relation Age of Onset  . COPD Mother   . Cancer Mother     lung  . Heart disease Mother   . Immunodeficiency Mother   . Heart disease Father     Social History Social History  Substance Use Topics  . Smoking status: Never Smoker  . Smokeless tobacco: Never Used  . Alcohol use No      Allergies   Morphine; Shellfish-derived products; Iodine; Iodine-131; Latex; and Tape   Review of Systems Review of Systems ROS reviewed and all are negative for acute change except as noted in the HPI.  Physical Exam Updated Vital Signs BP 130/65 (BP Location: Left Arm)   Pulse 81   Temp 97.8 F (36.6 C) (Oral)   Resp 18   Ht 5\' 5"  (1.651 m)   Wt 95.3 kg   SpO2 98%   BMI 34.95 kg/m   Physical Exam  Constitutional: Vital signs are normal. She appears well-developed and well-nourished. No distress.  HENT:  Head: Normocephalic and atraumatic. Head is without raccoon's eyes and without Battle's sign.  Right Ear: No hemotympanum.  Left Ear: No hemotympanum.  Nose: Nose normal.  Mouth/Throat: Uvula is midline, oropharynx is clear and moist and mucous membranes are normal.  Eyes: EOM are normal. Pupils are equal, round, and reactive to light.  Neck: Trachea normal, normal range of motion and full passive range of motion without pain. Neck supple. No spinous process tenderness and no muscular tenderness present. No tracheal deviation and normal range of motion present.  Cardiovascular: Normal rate, regular rhythm, S1 normal, S2 normal, normal heart sounds, intact distal pulses and normal pulses.   Pulmonary/Chest: Effort normal and  breath sounds normal. No respiratory distress. She has no decreased breath sounds. She has no wheezes. She has no rhonchi. She has no rales.  Abdominal: Normal appearance and bowel sounds are normal. There is no tenderness. There is no rigidity and no guarding.  Musculoskeletal: Normal range of motion.  Left Wrist ROM limited due to pain. Sensation intact. Motor intact. Cap refill <2sec. Distal pulses appreciated. No obvious deformity noted   Neurological: She is alert. She has normal strength. No cranial nerve deficit or sensory deficit.  Cranial Nerves:  II: Pupils equal, round, reactive to light III,IV, VI: ptosis not present, extra-ocular  motions intact bilaterally  V,VII: smile symmetric, facial light touch sensation equal VIII: hearing grossly normal bilaterally  IX,X: midline uvula rise  XI: bilateral shoulder shrug equal and strong XII: midline tongue extension  Skin: Skin is warm and dry.  Psychiatric: She has a normal mood and affect. Her speech is normal and behavior is normal.  Nursing note and vitals reviewed.  ED Treatments / Results  Labs (all labs ordered are listed, but only abnormal results are displayed) Labs Reviewed - No data to display  EKG  EKG Interpretation None       Radiology Dg Wrist Complete Left  Result Date: 07/01/2016 CLINICAL DATA:  Fall on ice at work with wrist pain. Initial encounter. EXAM: LEFT WRIST - COMPLETE 3+ VIEW COMPARISON:  None. FINDINGS: Comminuted intra-articular distal radius fracture. Mild ventral impaction. Loss of radial inclination. Ulnar styloid fracture with mildly rotated fragment. No visible carpus fracture.  Located radiocarpal joint. Osteopenic appearance for age. IMPRESSION: 1. Impacted intra-articular distal radius fracture. 2. Ulnar styloid process fracture. Electronically Signed   By: Monte Fantasia M.D.   On: 07/01/2016 08:47   Ct Head Wo Contrast  Result Date: 07/01/2016 CLINICAL DATA:  Posterior headache after falling and hitting the back of her head on ice. EXAM: CT HEAD WITHOUT CONTRAST TECHNIQUE: Contiguous axial images were obtained from the base of the skull through the vertex without intravenous contrast. COMPARISON:  None. FINDINGS: Brain: No evidence of acute infarction, hemorrhage, hydrocephalus, extra-axial collection or mass lesion/mass effect. Vascular: No hyperdense vessel or unexpected calcification. Skull: Normal. Negative for fracture or focal lesion. Sinuses/Orbits: No acute finding. Other: None. IMPRESSION: Normal examination. Electronically Signed   By: Claudie Revering M.D.   On: 07/01/2016 08:57    Procedures Procedures (including critical  care time)  Medications Ordered in ED Medications - No data to display   Initial Impression / Assessment and Plan / ED Course  I have reviewed the triage vital signs and the nursing notes.  Pertinent labs & imaging results that were available during my care of the patient were reviewed by me and considered in my medical decision making (see chart for details).    Final Clinical Impressions(s) / ED Diagnoses   {I have reviewed and evaluated the relevant imaging studies.  {I have reviewed the relevant previous healthcare records.  {I obtained HPI from historian.   ED Course:  Assessment: Patient X-Ray shows impacted intra-articular distal radial fracture with ulnar styloid process fracture. Consult to Ortho Hand (Kuzma) recommended sugar tong splint and follow up in office.  Pt advised to follow up with orthopedics. Patient given splint while in ED, conservative therapy recommended and discussed. Patient will be discharged home & is agreeable with above plan. Returns precautions discussed. Pt appears safe for discharge.  Disposition/Plan:  DC Home Additional Verbal discharge instructions given and discussed with patient.  Pt Instructed  to f/u with Ortho in the next week for evaluation and treatment of symptoms. Return precautions given Pt acknowledges and agrees with plan  Supervising Physician Francine Graven, DO  Final diagnoses:  Closed fracture of distal end of left radius, unspecified fracture morphology, initial encounter    New Prescriptions New Prescriptions   No medications on file     Shary Decamp, PA-C 07/01/16 [place], DO 07/04/16 1759

## 2016-07-01 NOTE — Discharge Instructions (Signed)
Please read and follow all provided instructions.  Your diagnoses today include:  1. Closed fracture of distal end of left radius, unspecified fracture morphology, initial encounter     Tests performed today include: Vital signs. See below for your results today.   Medications prescribed:  Take as prescribed   Home care instructions:  Follow any educational materials contained in this packet.  Follow-up instructions: Please follow-up with Orthopedics  for further evaluation of symptoms and treatment. Call office today.    Return instructions:  Please return to the Emergency Department if you do not get better, if you get worse, or new symptoms OR  - Fever (temperature greater than 101.29F)  - Bleeding that does not stop with holding pressure to the area    -Severe pain (please note that you may be more sore the day after your accident)  - Chest Pain  - Difficulty breathing  - Severe nausea or vomiting  - Inability to tolerate food and liquids  - Passing out  - Skin becoming red around your wounds  - Change in mental status (confusion or lethargy)  - New numbness or weakness    Please return if you have any other emergent concerns.  Additional Information:  Your vital signs today were: BP 130/65 (BP Location: Left Arm)    Pulse 81    Temp 97.8 F (36.6 C) (Oral)    Resp 18    Ht 5\' 5"  (1.651 m)    Wt 95.3 kg    SpO2 98%    BMI 34.95 kg/m  If your blood pressure (BP) was elevated above 135/85 this visit, please have this repeated by your doctor within one month. ---------------

## 2016-07-01 NOTE — ED Triage Notes (Signed)
Pt fell on the ice this morning at work and c/o pain to left wrist and right buttocks. Pt reports she hit her head upon falling but denies any pain in her head. Pt does report dizziness. Left radial pulses felt, capillary refill < 2 seconds to left hand, left hand warm to touch.

## 2016-07-12 DIAGNOSIS — M25632 Stiffness of left wrist, not elsewhere classified: Secondary | ICD-10-CM | POA: Insufficient documentation

## 2016-07-12 DIAGNOSIS — S52502A Unspecified fracture of the lower end of left radius, initial encounter for closed fracture: Secondary | ICD-10-CM | POA: Insufficient documentation

## 2016-07-12 DIAGNOSIS — M25532 Pain in left wrist: Secondary | ICD-10-CM | POA: Insufficient documentation

## 2016-07-12 DIAGNOSIS — M25432 Effusion, left wrist: Secondary | ICD-10-CM | POA: Insufficient documentation

## 2016-09-09 DIAGNOSIS — K8309 Other cholangitis: Secondary | ICD-10-CM | POA: Insufficient documentation

## 2017-05-31 ENCOUNTER — Encounter: Payer: Self-pay | Admitting: Physician Assistant

## 2017-05-31 ENCOUNTER — Ambulatory Visit: Payer: BLUE CROSS/BLUE SHIELD | Admitting: Physician Assistant

## 2017-05-31 VITALS — BP 136/84 | HR 124 | Temp 99.4°F | Ht 65.0 in | Wt 196.4 lb

## 2017-05-31 DIAGNOSIS — B349 Viral infection, unspecified: Secondary | ICD-10-CM | POA: Diagnosis not present

## 2017-05-31 DIAGNOSIS — M7022 Olecranon bursitis, left elbow: Secondary | ICD-10-CM | POA: Diagnosis not present

## 2017-05-31 DIAGNOSIS — I10 Essential (primary) hypertension: Secondary | ICD-10-CM

## 2017-05-31 MED ORDER — LISINOPRIL 10 MG PO TABS: 10.0000 mg | ORAL_TABLET | Freq: Every day | ORAL | 0 refills | Status: DC

## 2017-05-31 MED ORDER — AZITHROMYCIN 250 MG PO TABS: ORAL_TABLET | ORAL | 0 refills | Status: DC

## 2017-05-31 NOTE — Progress Notes (Signed)
BP 136/84   Pulse (!) 124   Temp 99.4 F (37.4 C) (Oral)   Ht 5\' 5"  (1.651 m)   Wt 196 lb 6.4 oz (89.1 kg)   BMI 32.68 kg/m    Subjective:    Patient ID: Erin Hurley, female    DOB: [birth year], 61 y.o.   MRN: [id number]  HPI: Erin Hurley is a 61 y.o. female presenting on 05/31/2017 for Elbow Pain (left ); Sore Throat; and Nasal Congestion  This patient has had many days of sore throat and postnasal drainage, headache at times and sinus pressure. There is copious drainage at times. Denies any fever at this time. There has been a history of sinus infections in the past.  There is cough at night. It has become more prevalent in recent days. This patient comes in also for left elbow pain that is been going on for several weeks.  She has taken over-the-counter NSAIDs without much relief.  She states it is not painful until she touches it.  She has never had an injury to this area.  She denies any fever or chills.  Relevant past medical, surgical, family and social history reviewed and updated as indicated. Allergies and medications reviewed and updated.  Past Medical History:  Diagnosis Date  . Cancer [facility]) 2009   breast  . Hypertension     Past Surgical History:  Procedure Laterality Date  . ABDOMINAL HYSTERECTOMY    . BREAST LUMPECTOMY Right   . BREAST REDUCTION SURGERY    . BREAST SURGERY    . JOINT REPLACEMENT Left    hip    Review of Systems  Constitutional: Negative.   HENT: Negative.   Eyes: Negative.   Respiratory: Negative.   Gastrointestinal: Negative.   Genitourinary: Negative.   Musculoskeletal: Positive for arthralgias and joint swelling.    Allergies as of 05/31/2017      Reactions   Glucosamine Itching   Morphine Nausea And Vomiting   Shellfish-derived Products Itching   Iodine Rash   Iodine-131 Rash   Latex Rash   Tape Rash   [Derm] use paper tape please [Derm] use paper tape please      Medication List        Accurate as of  05/31/17  9:25 AM. Always use your most recent med list.          azithromycin 250 MG tablet Commonly known as:  ZITHROMAX Z-PAK Take as directed   CENTRUM SILVER tablet Take 1 tablet by mouth daily.   esomeprazole 40 MG capsule Commonly known as:  NEXIUM Take 40 mg by mouth daily.   letrozole 2.5 MG tablet Commonly known as:  FEMARA Take 2.5 mg by mouth daily.   lisinopril 10 MG tablet Commonly known as:  PRINIVIL,ZESTRIL Take 1 tablet (10 mg total) by mouth daily.          Objective:    BP 136/84   Pulse (!) 124   Temp 99.4 F (37.4 C) (Oral)   Ht 5\' 5"  (1.651 m)   Wt 196 lb 6.4 oz (89.1 kg)   BMI 32.68 kg/m   Allergies  Allergen Reactions  . Glucosamine Itching  . Morphine Nausea And Vomiting  . Shellfish-Derived Products Itching  . Iodine Rash  . Iodine-131 Rash  . Latex Rash  . Tape Rash    [Derm] use paper tape please [Derm] use paper tape please    Physical Exam  Constitutional: She is oriented to person, place, and  time. She appears well-developed and well-nourished.  HENT:  Head: Normocephalic and atraumatic.  Right Ear: A middle ear effusion is present.  Left Ear: A middle ear effusion is present.  Nose: Mucosal edema present. Right sinus exhibits no frontal sinus tenderness. Left sinus exhibits no frontal sinus tenderness.  Mouth/Throat: Posterior oropharyngeal erythema present. No oropharyngeal exudate or tonsillar abscesses.  Eyes: Conjunctivae and EOM are normal. Pupils are equal, round, and reactive to light.  Neck: Normal range of motion.  Cardiovascular: Normal rate, regular rhythm, normal heart sounds and intact distal pulses.  Pulmonary/Chest: Effort normal and breath sounds normal.  Abdominal: Soft. Bowel sounds are normal.  Musculoskeletal:       Left elbow: She exhibits swelling. She exhibits no deformity. Tenderness found. Olecranon process tenderness noted.       Arms: Neurological: She is alert and oriented to person, place,  and time. She has normal reflexes.  Skin: Skin is warm and dry. No rash noted.  Psychiatric: She has a normal mood and affect. Her behavior is normal. Judgment and thought content normal.  Nursing note and vitals reviewed.   No results found for this or any previous visit.    Assessment & Plan:   1. Olecranon bursitis of left elbow Compression NSAID rest  2. Viral illness Information given Script printed is she worsens this week. - azithromycin (ZITHROMAX Z-PAK) 250 MG tablet; Take as directed  Dispense: 6 each; Refill: 0  3. Essential hypertension - lisinopril (PRINIVIL,ZESTRIL) 10 MG tablet; Take 1 tablet (10 mg total) by mouth daily.  Dispense: 30 tablet; Refill: 0    Current Outpatient Medications:  .  azithromycin (ZITHROMAX Z-PAK) 250 MG tablet, Take as directed, Disp: 6 each, Rfl: 0 .  esomeprazole (NEXIUM) 40 MG capsule, Take 40 mg by mouth daily. , Disp: , Rfl:  .  letrozole (FEMARA) 2.5 MG tablet, Take 2.5 mg by mouth daily., Disp: , Rfl:  .  lisinopril (PRINIVIL,ZESTRIL) 10 MG tablet, Take 1 tablet (10 mg total) by mouth daily., Disp: 30 tablet, Rfl: 0 .  Multiple Vitamins-Minerals (CENTRUM SILVER) tablet, Take 1 tablet by mouth daily., Disp: , Rfl:  Continue all other maintenance medications as listed above.  Follow up plan: Return in about 4 weeks (around 06/28/2017) for Dr Evette Doffing follow up.  Educational handout given for Dwaine Deter PA-C [facility] [address]  [place], [place] [address] [phone]   05/31/2017, 9:25 AM

## 2017-05-31 NOTE — Patient Instructions (Signed)

## 2017-06-28 ENCOUNTER — Encounter: Payer: Self-pay | Admitting: Pediatrics

## 2017-06-28 ENCOUNTER — Ambulatory Visit (INDEPENDENT_AMBULATORY_CARE_PROVIDER_SITE_OTHER): Payer: BLUE CROSS/BLUE SHIELD

## 2017-06-28 ENCOUNTER — Ambulatory Visit: Payer: BLUE CROSS/BLUE SHIELD | Admitting: Pediatrics

## 2017-06-28 VITALS — BP 138/83 | HR 89 | Temp 98.6°F | Ht 65.0 in | Wt 198.6 lb

## 2017-06-28 DIAGNOSIS — M25561 Pain in right knee: Secondary | ICD-10-CM

## 2017-06-28 DIAGNOSIS — I10 Essential (primary) hypertension: Secondary | ICD-10-CM

## 2017-06-28 DIAGNOSIS — Z1159 Encounter for screening for other viral diseases: Secondary | ICD-10-CM | POA: Diagnosis not present

## 2017-06-28 MED ORDER — LISINOPRIL 10 MG PO TABS: 10.0000 mg | ORAL_TABLET | Freq: Every day | ORAL | 1 refills | Status: DC

## 2017-06-28 NOTE — Progress Notes (Signed)
  Subjective:   Patient ID: Erin Hurley, female    DOB: 06-Aug-1955, 62 y.o.   MRN: [id number] CC: Medication Refill and Knee Pain (Right, 2 weeks)  HPI: Erin Hurley is a 62 y.o. female presenting for Medication Refill and Knee Pain (Right, 2 weeks)  R knee bothering her for past 2 weeks Walking is ok, going up and down stairs bothers her the most Ibuprofen helps with pain, took '400mg'$  once a day for the last couple weeks, but still with pressure in her Has not given out from under her So she was seen by sports medicine about a year ago for her knee when she sat with her leg bent underneath her and felt something pop in her knee Had some pain in her right knee at that time which resolved over the next few weeks  had some swelling in her left elbow/olecranon bursitis Otherwise has not had any other joints there is swollen.  No red redness.  No rash.  HTN: checks at home, 120s/80 usually No chest pain Taking her medicines regularly  History of breast cancer.  Takes letrozole daily.  Follows with oncology.  Follows up with both [place] and her family doctor in [place]  Relevant past medical, surgical, family and social history reviewed. Allergies and medications reviewed and updated. Social History   Tobacco Use  Smoking Status Never Smoker  Smokeless Tobacco Never Used   ROS: Per HPI   Objective:    BP 138/83   Pulse 89   Temp 98.6 F (37 C) (Oral)   Ht '5\' 5"'$  (1.651 m)   Wt 198 lb 9.6 oz (90.1 kg)   BMI 33.05 kg/m   Wt Readings from Last 3 Encounters:  06/28/17 198 lb 9.6 oz (90.1 kg)  05/31/17 196 lb 6.4 oz (89.1 kg)  07/01/16 210 lb (95.3 kg)    Gen: NAD, alert, cooperative with exam, NCAT EYES: EOMI, no conjunctival injection, or no icterus CV: NRRR, normal S1/S2, no murmur, distal pulses 2+ b/l Resp: CTABL, no wheezes, normal WOB Ext: No edema, warm Neuro: Alert and oriented, strength equal b/l UE and LE, coordination grossly normal MSK: moderate  effusion present right knee compared to left No redness, no joint line tenderness bilaterally Easily mobile patella Crepitus present bilaterally Slightly decreased range of motion right knee compared to left with flexion  Assessment & Plan:  Erin Hurley was seen today for medication refill and knee pain.  Diagnoses and all orders for this visit:  Essential hypertension Slightly elevated today, continue to check at home.  If regularly greater than 140 or greater than 90 patient needs to let us know. -     BMP8+EGFR -     lisinopril (PRINIVIL,ZESTRIL) 10 MG tablet; Take 1 tablet (10 mg total) by mouth daily.  Need for hepatitis C screening test -     Hepatitis C antibody  Acute pain of right knee Rest, NSAIDs, ice.  We will get x-ray to evaluate for arthritis. -     DG Knee 1-2 Views Right; Future  H/o breast ca Following with oncology.  Follow up plan: Due for physical, patient to schedule with her primary care doctor, here or in [place], MD [place]

## 2017-06-29 ENCOUNTER — Telehealth: Payer: Self-pay | Admitting: Pediatrics

## 2017-06-29 LAB — BMP8+EGFR
BUN/Creatinine Ratio: 13 (ref 9–23)
BUN: 10 mg/dL (ref 6–24)
CO2: 23 mmol/L (ref 20–29)
Calcium: 9.4 mg/dL (ref 8.7–10.2)
Chloride: 105 mmol/L (ref 96–106)
Creatinine, Ser: 0.77 mg/dL (ref 0.57–1.00)
GFR calc Af Amer: 98 mL/min/{1.73_m2} (ref >59)
GFR calc non Af Amer: 85 mL/min/{1.73_m2} (ref >59)
Glucose: 84 mg/dL (ref 65–99)
Potassium: 4.8 mmol/L (ref 3.5–5.2)
Sodium: 143 mmol/L (ref 134–144)

## 2017-06-29 LAB — HEPATITIS C ANTIBODY: Hep C Virus Ab: <0.1 s/co ratio (ref 0.0–0.9)

## 2017-06-29 NOTE — Telephone Encounter (Signed)
Patient notified of xray results and recommendations from Dr Evette Doffing. Patient verbalized understanding

## 2017-09-23 DIAGNOSIS — M8589 Other specified disorders of bone density and structure, multiple sites: Secondary | ICD-10-CM | POA: Diagnosis not present

## 2017-09-23 DIAGNOSIS — C50911 Malignant neoplasm of unspecified site of right female breast: Secondary | ICD-10-CM | POA: Diagnosis not present

## 2017-09-23 DIAGNOSIS — M8580 Other specified disorders of bone density and structure, unspecified site: Secondary | ICD-10-CM | POA: Diagnosis not present

## 2017-09-23 DIAGNOSIS — Z170 Estrogen receptor positive status [ER+]: Secondary | ICD-10-CM | POA: Diagnosis not present

## 2017-09-23 DIAGNOSIS — M2550 Pain in unspecified joint: Secondary | ICD-10-CM | POA: Diagnosis not present

## 2017-09-23 DIAGNOSIS — M1991 Primary osteoarthritis, unspecified site: Secondary | ICD-10-CM | POA: Diagnosis not present

## 2017-09-23 DIAGNOSIS — Z79811 Long term (current) use of aromatase inhibitors: Secondary | ICD-10-CM | POA: Diagnosis not present

## 2017-11-04 IMAGING — DX DG WRIST COMPLETE 3+V*L*
4 series · 4 of 4 positions shown · non-contrast
Comparison: None.

CLINICAL DATA: Fall on ice at work with wrist pain. Initial
encounter.

EXAM:
LEFT WRIST - COMPLETE 3+ VIEW

[wrist pa]
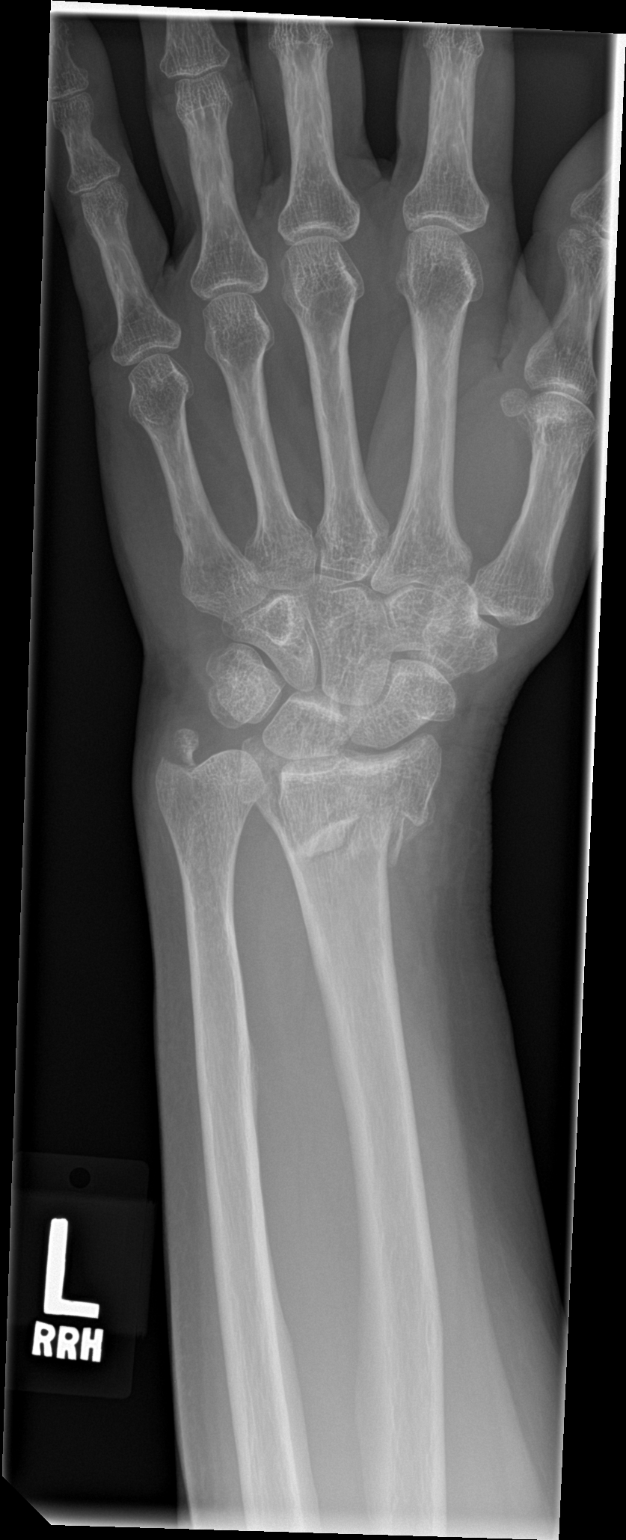

[wrist obl]
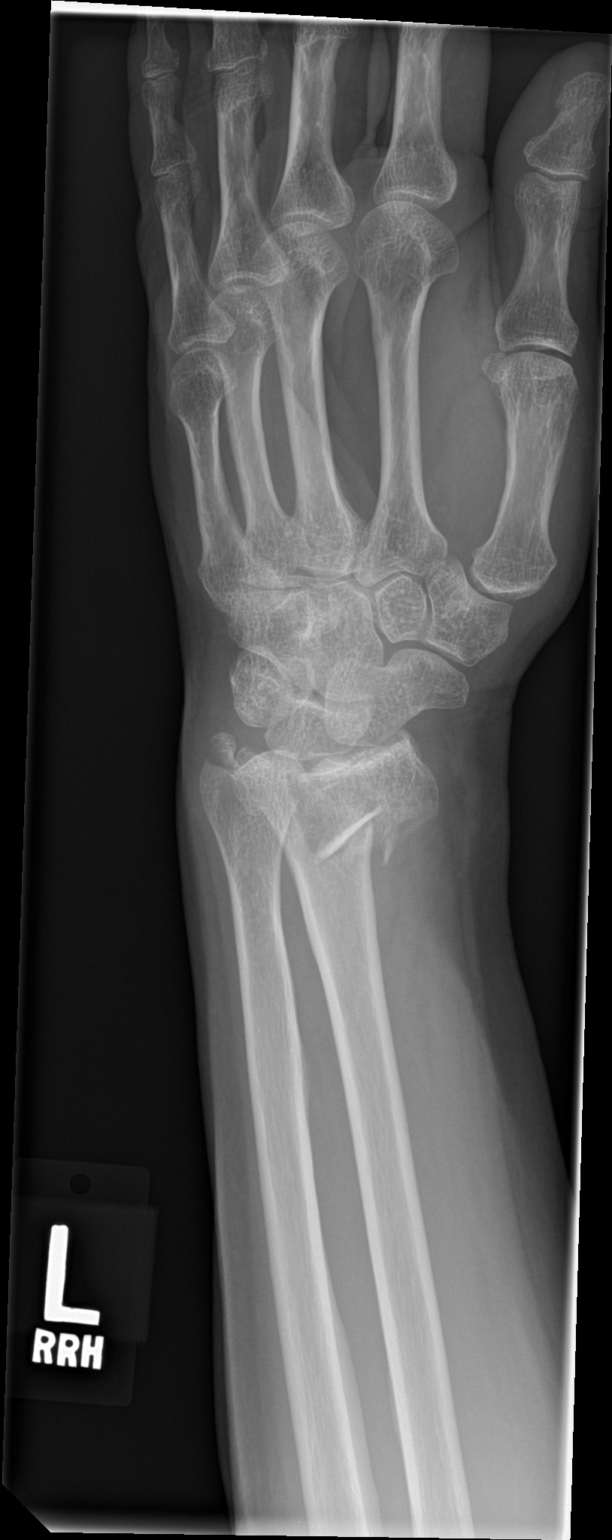

[wrist lat]
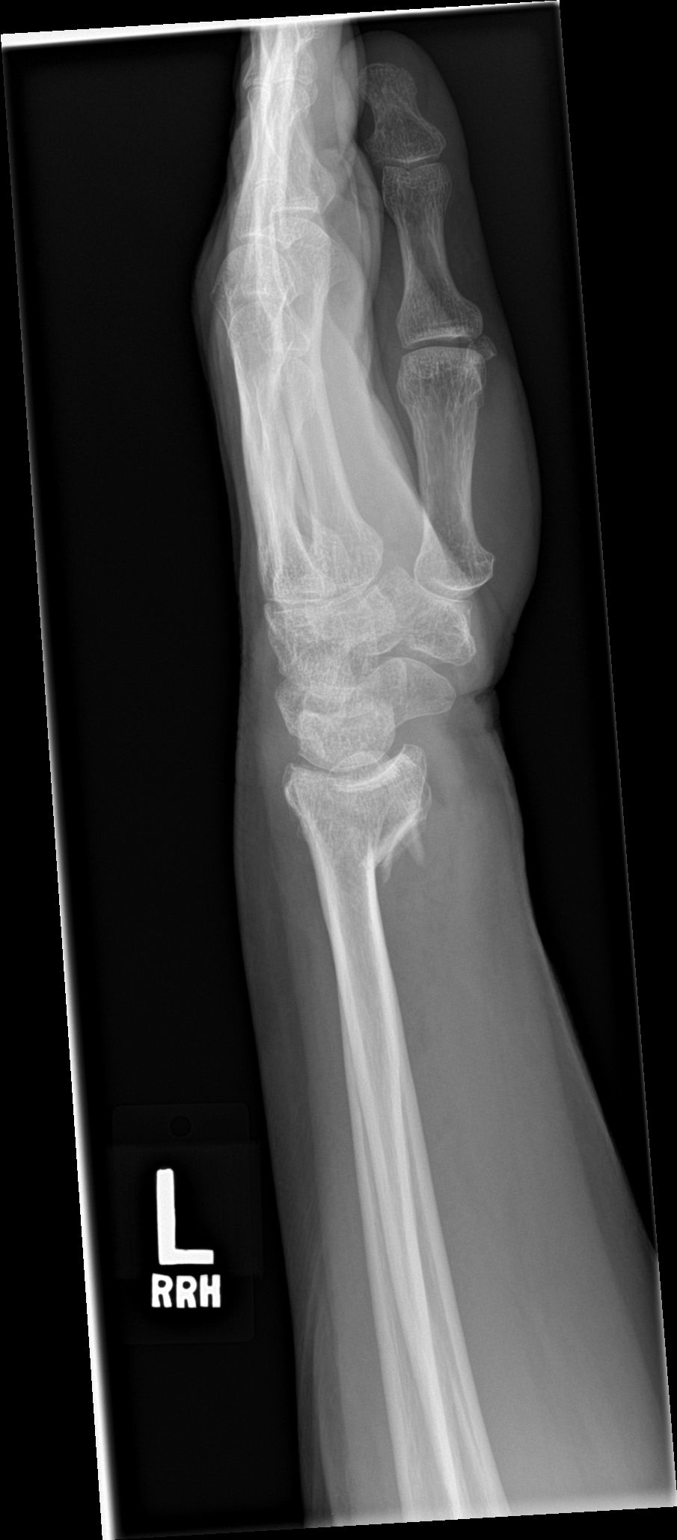

[wrist navicular]
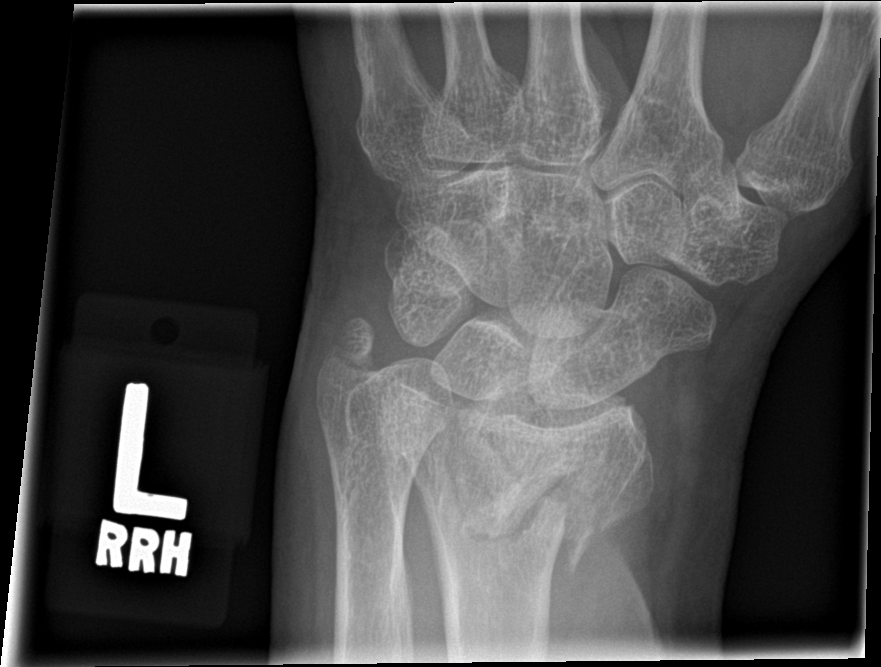

[4 of 4 positions shown; findings below may reference images not displayed]

FINDINGS: Comminuted intra-articular distal radius fracture. Mild ventral
impaction. Loss of radial inclination.

Ulnar styloid fracture with mildly rotated fragment.

No visible carpus fracture.  Located radiocarpal joint.

Osteopenic appearance for age.
IMPRESSION: 1. Impacted intra-articular distal radius fracture.
2. Ulnar styloid process fracture.

## 2017-12-28 ENCOUNTER — Ambulatory Visit: Payer: BLUE CROSS/BLUE SHIELD | Admitting: Pediatrics

## 2017-12-28 ENCOUNTER — Encounter: Payer: Self-pay | Admitting: Pediatrics

## 2017-12-28 VITALS — BP 129/81 | HR 88 | Temp 98.1°F | Ht 65.0 in | Wt 208.4 lb

## 2017-12-28 DIAGNOSIS — I10 Essential (primary) hypertension: Secondary | ICD-10-CM

## 2017-12-28 DIAGNOSIS — Z6834 Body mass index (BMI) 34.0-34.9, adult: Secondary | ICD-10-CM

## 2017-12-28 MED ORDER — LISINOPRIL 10 MG PO TABS: 10.0000 mg | ORAL_TABLET | Freq: Every day | ORAL | 1 refills | Status: DC

## 2017-12-28 NOTE — Progress Notes (Signed)
  Subjective:   Patient ID: Erin Hurley, female    DOB: 08-21-1955, 62 y.o.   MRN: [id number] CC: Medical Management of Chronic Issues  HPI: Erin Hurley is a 62 y.o. female   HTN: Taking medicine regularly, no CP, SOB  Elevated BMI: Trying to walk regularly.  Avoiding sugary foods.  Relevant past medical, surgical, family and social history reviewed. Allergies and medications reviewed and updated. Social History   Tobacco Use  Smoking Status Never Smoker  Smokeless Tobacco Never Used   ROS: Per HPI   Objective:    BP 129/81   Pulse 88   Temp 98.1 F (36.7 C) (Oral)   Ht '5\' 5"'$  (1.651 m)   Wt 208 lb 6.4 oz (94.5 kg)   BMI 34.68 kg/m   Wt Readings from Last 3 Encounters:  12/28/17 208 lb 6.4 oz (94.5 kg)  06/28/17 198 lb 9.6 oz (90.1 kg)  05/31/17 196 lb 6.4 oz (89.1 kg)    Gen: NAD, alert, cooperative with exam, NCAT EYES: EOMI, no conjunctival injection, or no icterus ENT:  TMs pearly gray b/l, OP without erythema LYMPH: no cervical LAD CV: NRRR, normal S1/S2, no murmur, distal pulses 2+ b/l Resp: CTABL, no wheezes, normal WOB Abd: +BS, soft, NTND. no guarding or organomegaly Ext: No edema, warm Neuro: Alert and oriented, strength equal b/l UE and LE, coordination grossly normal MSK: normal muscle bulk  Assessment & Plan:  Erin Hurley was seen today for medical management of chronic issues.  Diagnoses and all orders for this visit:  Essential hypertension Stable, continue current medicine -     lisinopril (PRINIVIL,ZESTRIL) 10 MG tablet; Take 1 tablet (10 mg total) by mouth daily. -     BMP8+EGFR  BMI 34.0-34.9,adult Continue lifestyle changes. -     Lipid panel   Follow up plan: Return in about 3 months (around 03/30/2018) for CPE. Assunta Found, MD [place]

## 2017-12-29 LAB — BMP8+EGFR
BUN/Creatinine Ratio: 13 (ref 12–28)
BUN: 8 mg/dL (ref 8–27)
CO2: 25 mmol/L (ref 20–29)
Calcium: 9.3 mg/dL (ref 8.7–10.3)
Chloride: 106 mmol/L (ref 96–106)
Creatinine, Ser: 0.63 mg/dL (ref 0.57–1.00)
GFR calc Af Amer: 113 mL/min/{1.73_m2} (ref >59)
GFR calc non Af Amer: 98 mL/min/{1.73_m2} (ref >59)
Glucose: 89 mg/dL (ref 65–99)
Potassium: 4.3 mmol/L (ref 3.5–5.2)
Sodium: 145 mmol/L — ABNORMAL HIGH (ref 134–144)

## 2017-12-29 LAB — LIPID PANEL
Chol/HDL Ratio: 3.6 ratio (ref 0.0–4.4)
Cholesterol, Total: 189 mg/dL (ref 100–199)
HDL: 52 mg/dL (ref >39)
LDL Calculated: 125 mg/dL — ABNORMAL HIGH (ref 0–99)
Triglycerides: 60 mg/dL (ref 0–149)
VLDL Cholesterol Cal: 12 mg/dL (ref 5–40)

## 2018-03-15 ENCOUNTER — Ambulatory Visit (INDEPENDENT_AMBULATORY_CARE_PROVIDER_SITE_OTHER): Payer: BLUE CROSS/BLUE SHIELD | Admitting: Pediatrics

## 2018-03-15 ENCOUNTER — Encounter: Payer: Self-pay | Admitting: Pediatrics

## 2018-03-15 ENCOUNTER — Telehealth: Payer: Self-pay | Admitting: Pediatrics

## 2018-03-15 VITALS — BP 129/81 | HR 83 | Temp 99.3°F | Ht 65.0 in | Wt 211.6 lb

## 2018-03-15 DIAGNOSIS — I10 Essential (primary) hypertension: Secondary | ICD-10-CM

## 2018-03-15 DIAGNOSIS — K219 Gastro-esophageal reflux disease without esophagitis: Secondary | ICD-10-CM | POA: Diagnosis not present

## 2018-03-15 DIAGNOSIS — Z6835 Body mass index (BMI) 35.0-35.9, adult: Secondary | ICD-10-CM

## 2018-03-15 DIAGNOSIS — Z0000 Encounter for general adult medical examination without abnormal findings: Secondary | ICD-10-CM | POA: Diagnosis not present

## 2018-03-15 MED ORDER — LISINOPRIL 10 MG PO TABS: 10.0000 mg | ORAL_TABLET | Freq: Every day | ORAL | 1 refills | Status: DC

## 2018-03-15 MED ORDER — PANTOPRAZOLE SODIUM 40 MG PO TBEC: 40.0000 mg | DELAYED_RELEASE_TABLET | Freq: Every day | ORAL | 3 refills | Status: DC

## 2018-03-15 NOTE — Patient Instructions (Addendum)
DASH Eating Plan DASH stands for "Dietary Approaches to Stop Hypertension." The DASH eating plan is a healthy eating plan that has been shown to reduce high blood pressure (hypertension). It may also reduce your risk for type 2 diabetes, heart disease, and stroke. The DASH eating plan may also help with weight loss. What are tips for following this plan? General guidelines  Avoid eating more than 2,300 mg (milligrams) of salt (sodium) a day. If you have hypertension, you may need to reduce your sodium intake to 1,500 mg a day.  Limit alcohol intake to no more than 1 drink a day for nonpregnant women and 2 drinks a day for men. One drink equals 12 oz of beer, 5 oz of wine, or 1 oz of hard liquor.  Work with your health care provider to maintain a healthy body weight or to lose weight. Ask what an ideal weight is for you.  Get at least 30 minutes of exercise that causes your heart to beat faster (aerobic exercise) most days of the week. Activities may include walking, swimming, or biking.  Work with your health care provider or diet and nutrition specialist (dietitian) to adjust your eating plan to your individual calorie needs. Reading food labels  Check food labels for the amount of sodium per serving. Choose foods with less than 5 percent of the Daily Value of sodium. Generally, foods with less than 300 mg of sodium per serving fit into this eating plan.  To find whole grains, look for the word "whole" as the first word in the ingredient list. Shopping  Buy products labeled as "low-sodium" or "no salt added."  Buy fresh foods. Avoid canned foods and premade or frozen meals. Cooking  Avoid adding salt when cooking. Use salt-free seasonings or herbs instead of table salt or sea salt. Check with your health care provider or pharmacist before using salt substitutes.  Do not fry foods. Cook foods using healthy methods such as baking, boiling, grilling, and broiling instead.  Cook with  heart-healthy oils, such as olive, canola, soybean, or sunflower oil. Meal planning   Eat a balanced diet that includes: ? 5 or more servings of fruits and vegetables each day. At each meal, try to fill half of your plate with fruits and vegetables. ? Up to 6-8 servings of whole grains each day. ? Less than 6 oz of lean meat, poultry, or fish each day. A 3-oz serving of meat is about the same size as a deck of cards. One egg equals 1 oz. ? 2 servings of low-fat dairy each day. ? A serving of nuts, seeds, or beans 5 times each week. ? Heart-healthy fats. Healthy fats called Omega-3 fatty acids are found in foods such as flaxseeds and coldwater fish, like sardines, salmon, and mackerel.  Limit how much you eat of the following: ? Canned or prepackaged foods. ? Food that is high in trans fat, such as fried foods. ? Food that is high in saturated fat, such as fatty meat. ? Sweets, desserts, sugary drinks, and other foods with added sugar. ? Full-fat dairy products.  Do not salt foods before eating.  Try to eat at least 2 vegetarian meals each week.  Eat more home-cooked food and less restaurant, buffet, and fast food.  When eating at a restaurant, ask that your food be prepared with less salt or no salt, if possible. What foods are recommended? The items listed may not be a complete list. Talk with your dietitian about  what dietary choices are best for you. Grains Whole-grain or whole-wheat bread. Whole-grain or whole-wheat pasta. Brown rice. Oatmeal. Quinoa. Bulgur. Whole-grain and low-sodium cereals. Pita bread. Low-fat, low-sodium crackers. Whole-wheat flour tortillas. Vegetables Fresh or frozen vegetables (raw, steamed, roasted, or grilled). Low-sodium or reduced-sodium tomato and vegetable juice. Low-sodium or reduced-sodium tomato sauce and tomato paste. Low-sodium or reduced-sodium canned vegetables. Fruits All fresh, dried, or frozen fruit. Canned fruit in natural juice (without  added sugar). Meat and other protein foods Skinless chicken or turkey. Ground chicken or turkey. Pork with fat trimmed off. Fish and seafood. Egg whites. Dried beans, peas, or lentils. Unsalted nuts, nut butters, and seeds. Unsalted canned beans. Lean cuts of beef with fat trimmed off. Low-sodium, lean deli meat. Dairy Low-fat (1%) or fat-free (skim) milk. Fat-free, low-fat, or reduced-fat cheeses. Nonfat, low-sodium ricotta or cottage cheese. Low-fat or nonfat yogurt. Low-fat, low-sodium cheese. Fats and oils Soft margarine without trans fats. Vegetable oil. Low-fat, reduced-fat, or light mayonnaise and salad dressings (reduced-sodium). Canola, safflower, olive, soybean, and sunflower oils. Avocado. Seasoning and other foods Herbs. Spices. Seasoning mixes without salt. Unsalted popcorn and pretzels. Fat-free sweets. What foods are not recommended? The items listed may not be a complete list. Talk with your dietitian about what dietary choices are best for you. Grains Baked goods made with fat, such as croissants, muffins, or some breads. Dry pasta or rice meal packs. Vegetables Creamed or fried vegetables. Vegetables in a cheese sauce. Regular canned vegetables (not low-sodium or reduced-sodium). Regular canned tomato sauce and paste (not low-sodium or reduced-sodium). Regular tomato and vegetable juice (not low-sodium or reduced-sodium). Pickles. Olives. Fruits Canned fruit in a light or heavy syrup. Fried fruit. Fruit in cream or butter sauce. Meat and other protein foods Fatty cuts of meat. Ribs. Fried meat. Bacon. Sausage. Bologna and other processed lunch meats. Salami. Fatback. Hotdogs. Bratwurst. Salted nuts and seeds. Canned beans with added salt. Canned or smoked fish. Whole eggs or egg yolks. Chicken or turkey with skin. Dairy Whole or 2% milk, cream, and half-and-half. Whole or full-fat cream cheese. Whole-fat or sweetened yogurt. Full-fat cheese. Nondairy creamers. Whipped toppings.  Processed cheese and cheese spreads. Fats and oils Butter. Stick margarine. Lard. Shortening. Ghee. Bacon fat. Tropical oils, such as coconut, palm kernel, or palm oil. Seasoning and other foods Salted popcorn and pretzels. Onion salt, garlic salt, seasoned salt, table salt, and sea salt. Worcestershire sauce. Tartar sauce. Barbecue sauce. Teriyaki sauce. Soy sauce, including reduced-sodium. Steak sauce. Canned and packaged gravies. Fish sauce. Oyster sauce. Cocktail sauce. Horseradish that you find on the shelf. Ketchup. Mustard. Meat flavorings and tenderizers. Bouillon cubes. Hot sauce and Tabasco sauce. Premade or packaged marinades. Premade or packaged taco seasonings. Relishes. Regular salad dressings. Where to find more information:  National Heart, Lung, and Blood Institute: www.nhlbi.nih.gov  American Heart Association: www.heart.org Summary  The DASH eating plan is a healthy eating plan that has been shown to reduce high blood pressure (hypertension). It may also reduce your risk for type 2 diabetes, heart disease, and stroke.  With the DASH eating plan, you should limit salt (sodium) intake to 2,300 mg a day. If you have hypertension, you may need to reduce your sodium intake to 1,500 mg a day.  When on the DASH eating plan, aim to eat more fresh fruits and vegetables, whole grains, lean proteins, low-fat dairy, and heart-healthy fats.  Work with your health care provider or diet and nutrition specialist (dietitian) to adjust your eating plan to your   individual calorie needs. This information is not intended to replace advice given to you by your health care provider. Make sure you discuss any questions you have with your health care provider. Document Released: 05/20/2011 Document Revised: 05/24/2016 Document Reviewed: 05/24/2016 Elsevier Interactive Patient Education  2018 [place] for Gastroesophageal Reflux Disease, Adult When you have gastroesophageal  reflux disease (GERD), the foods you eat and your eating habits are very important. Choosing the right foods can help ease your discomfort. What guidelines do I need to follow?  Choose fruits, vegetables, whole grains, and low-fat dairy products.  Choose low-fat meat, fish, and poultry.  Limit fats such as oils, salad dressings, butter, nuts, and avocado.  Keep a food diary. This helps you identify foods that cause symptoms.  Avoid foods that cause symptoms. These may be different for everyone.  Eat small meals often instead of 3 large meals a day.  Eat your meals slowly, in a place where you are relaxed.  Limit fried foods.  Cook foods using methods other than frying.  Avoid drinking alcohol.  Avoid drinking large amounts of liquids with your meals.  Avoid bending over or lying down until 2-3 hours after eating. What foods are not recommended? These are some foods and drinks that may make your symptoms worse: Vegetables Tomatoes. Tomato juice. Tomato and spaghetti sauce. Chili peppers. Onion and garlic. Horseradish. Fruits Oranges, grapefruit, and lemon (fruit and juice). Meats High-fat meats, fish, and poultry. This includes hot dogs, ribs, ham, sausage, salami, and bacon. Dairy Whole milk and chocolate milk. Sour cream. Cream. Butter. Ice cream. Cream cheese. Drinks Coffee and tea. Bubbly (carbonated) drinks or energy drinks. Condiments Hot sauce. Barbecue sauce. Sweets/Desserts Chocolate and cocoa. Donuts. Peppermint and spearmint. Fats and Oils High-fat foods. This includes [place] fries and potato chips. Other Vinegar. Strong spices. This includes black pepper, white pepper, red pepper, cayenne, curry powder, cloves, ginger, and chili powder. The items listed above may not be a complete list of foods and drinks to avoid. Contact your dietitian for more information. This information is not intended to replace advice given to you by your health care provider. Make  sure you discuss any questions you have with your health care provider. Document Released: 11/30/2011 Document Revised: 11/06/2015 Document Reviewed: 04/04/2013 Elsevier Interactive Patient Education  2017 [employer].

## 2018-03-15 NOTE — Progress Notes (Signed)
  Subjective:   Patient ID: Erin Hurley, female    DOB: 24-Sep-1955, 62 y.o.   MRN: [id number] CC: Annual Exam and Gastroesophageal Reflux  HPI: Erin Hurley is a 62 y.o. female   Heart burn: having symptoms daily for the last month, taking pepcid up to 2-3 times a day for past two weeks, OTC nexium daily for past 18 days, was off of it for about a month before.  The largest meal evening time before bed.  Stays quite busy with work.  Elevated BMI: trying to lose weight.  Trying to avoid sugary foods.  Crackers and chips are harder to avoid.  Does approximately 20 minutes on stationary bikes most days.  Hypertension: Taking medicine regularly, no side effects.  Mammogram: Up-to-date. Pap smear: Status post hysterectomy Colonoscopy: Up-to-date  Relevant past medical, surgical, family and social history reviewed. Allergies and medications reviewed and updated. Social History   Tobacco Use  Smoking Status Never Smoker  Smokeless Tobacco Never Used   ROS: All systems negative other than what is in HPI.  Objective:    BP 129/81   Pulse 83   Temp 99.3 F (37.4 C) (Oral)   Ht 5\' 5"  (1.651 m)   Wt 211 lb 9.6 oz (96 kg)   BMI 35.21 kg/m   Wt Readings from Last 3 Encounters:  03/15/18 211 lb 9.6 oz (96 kg)  12/28/17 208 lb 6.4 oz (94.5 kg)  06/28/17 198 lb 9.6 oz (90.1 kg)    Gen: NAD, alert, cooperative with exam, NCAT EYES: EOMI, no conjunctival injection, or no icterus ENT:  TMs pearly gray b/l, OP without erythema LYMPH: no cervical LAD CV: NRRR, normal S1/S2, no murmur, distal pulses 2+ b/l Resp: CTABL, no wheezes, normal WOB Abd: +BS, soft, NTND. no guarding or organomegaly Ext: No edema, warm Neuro: Alert and oriented, strength equal b/l UE and LE, coordination grossly normal MSK: normal muscle bulk  Assessment & Plan:  Erin Hurley was seen today for annual exam and gastroesophageal reflux.  Diagnoses and all orders for this visit:  Encounter for preventive  health examination -     Basic Metabolic Panel -     TSH  Essential hypertension Stable, continue current medicines.  Gastroesophageal reflux disease, esophagitis presence not specified Symptomatic relief, food choices, timing of meals discussed.  Try to make lunch and dinner.  Try to stay upright at least 30 minutes to an hour after eating.  Start below.  Take daily in the morning.  If ongoing symptoms may need to GI. -     pantoprazole (PROTONIX) 40 MG tablet; Take 1 tablet (40 mg total) by mouth daily.  BMI 35 Continue lifestyle changes, increased physical activity, decreasing sugary food and carbohydrate intake.  Healthy snacks discussed.  Follow up plan: Return in about 6 months (around 09/14/2018). Assunta Found, MD [place]

## 2018-03-16 LAB — TSH: TSH: 2.53 u[IU]/mL (ref 0.450–4.500)

## 2018-03-16 LAB — BASIC METABOLIC PANEL
BUN/Creatinine Ratio: 17 (ref 12–28)
BUN: 11 mg/dL (ref 8–27)
CO2: 24 mmol/L (ref 20–29)
Calcium: 9.2 mg/dL (ref 8.7–10.3)
Chloride: 104 mmol/L (ref 96–106)
Creatinine, Ser: 0.65 mg/dL (ref 0.57–1.00)
GFR calc Af Amer: 112 mL/min/{1.73_m2} (ref >59)
GFR calc non Af Amer: 97 mL/min/{1.73_m2} (ref >59)
Glucose: 84 mg/dL (ref 65–99)
Potassium: 4.4 mmol/L (ref 3.5–5.2)
Sodium: 141 mmol/L (ref 134–144)

## 2018-03-16 NOTE — Telephone Encounter (Signed)
Appt made with Monia Pouch due to provider being out

## 2018-03-30 ENCOUNTER — Telehealth: Payer: Self-pay | Admitting: Pediatrics

## 2018-03-30 NOTE — Telephone Encounter (Signed)
Patient aware that rx was sent to Erin Hurley. I advised her that [facility] and she said that she would pick up at [facility].

## 2018-06-01 ENCOUNTER — Telehealth: Payer: Self-pay | Admitting: Pediatrics

## 2018-06-01 DIAGNOSIS — I10 Essential (primary) hypertension: Secondary | ICD-10-CM

## 2018-06-01 MED ORDER — LISINOPRIL 10 MG PO TABS: 10.0000 mg | ORAL_TABLET | Freq: Every day | ORAL | 1 refills | Status: DC

## 2018-06-01 NOTE — Telephone Encounter (Signed)
Lisinopril sent to pharmacy.  Patient aware

## 2018-08-29 ENCOUNTER — Other Ambulatory Visit: Payer: Self-pay | Admitting: *Deleted

## 2018-08-29 DIAGNOSIS — I10 Essential (primary) hypertension: Secondary | ICD-10-CM

## 2018-08-29 MED ORDER — LISINOPRIL 10 MG PO TABS: 10.0000 mg | ORAL_TABLET | Freq: Every day | ORAL | 0 refills | Status: DC

## 2018-09-15 ENCOUNTER — Ambulatory Visit: Payer: BLUE CROSS/BLUE SHIELD | Admitting: Family Medicine

## 2018-11-01 IMAGING — DX DG KNEE 1-2V*R*
2 series · 2 of 2 positions shown · non-contrast
Comparison: Right knee radiographs of August 09, 2014.

CLINICAL DATA: Right knee pain, acute. No known injury. History of
breast malignancy.

EXAM:
RIGHT KNEE - 1-2 VIEW

[knee ap]
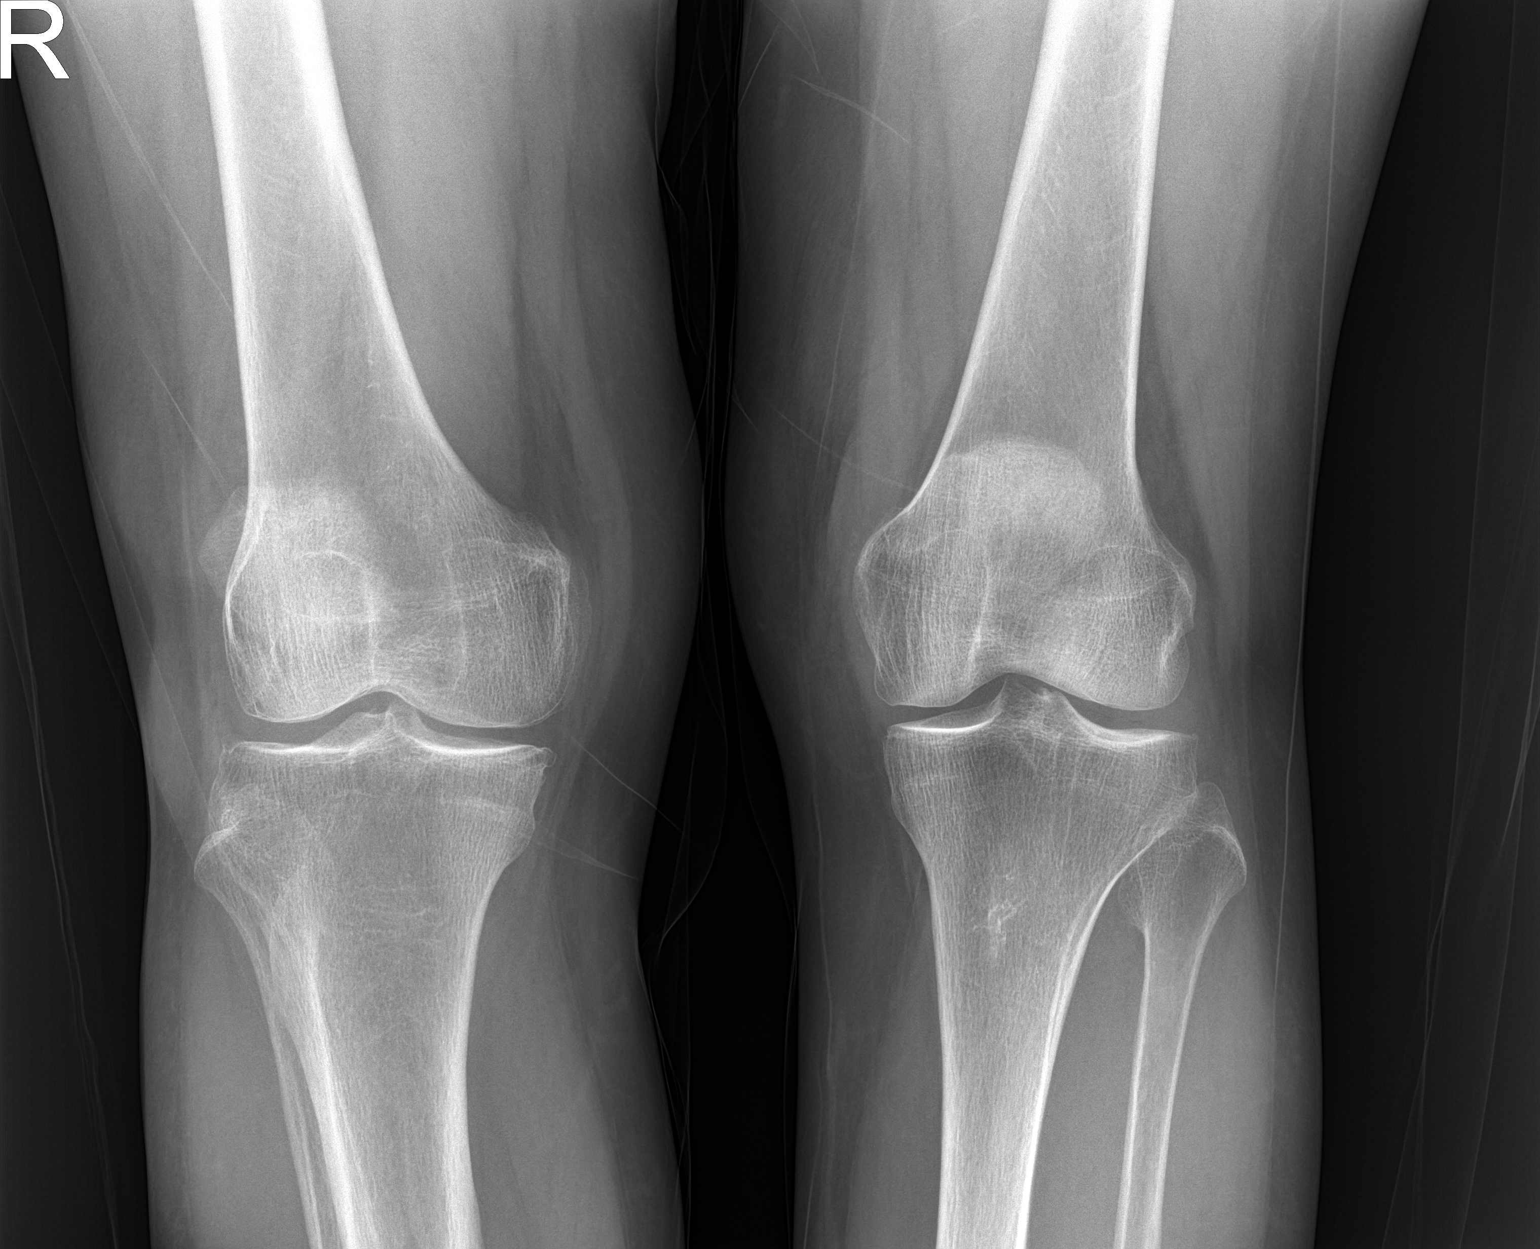

[knee lat]
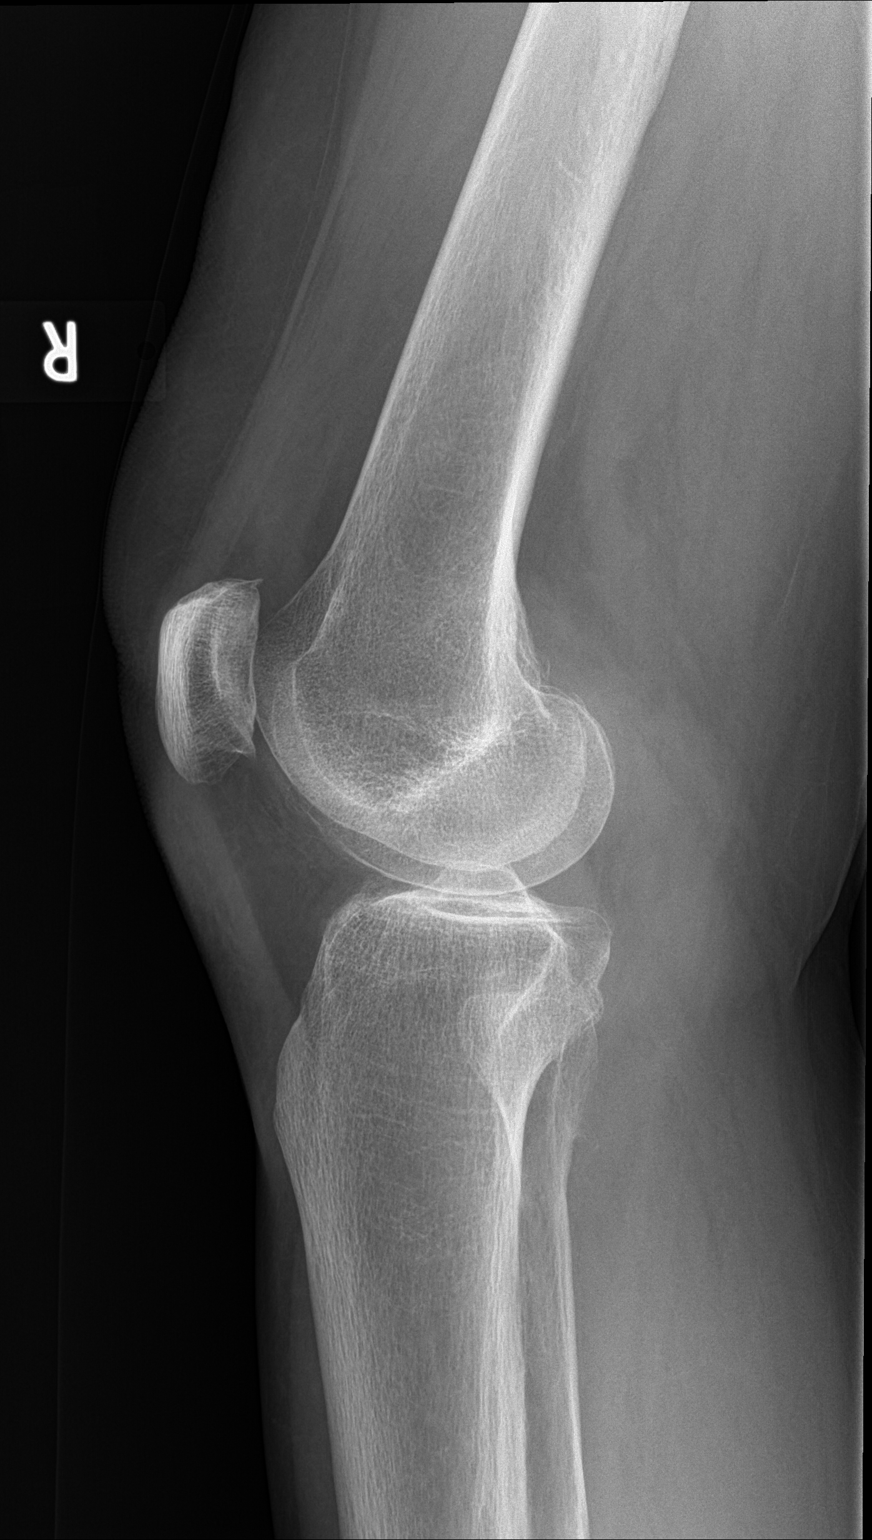

[2 of 2 positions shown; findings below may reference images not displayed]

FINDINGS: The bones are subjectively adequately mineralized. The joint spaces
are well maintained. There is mild beaking of the medial tibial
spine and of the superior and inferior articular margins of the
patella. There is no acute fracture nor dislocation. There is no
joint effusion.
IMPRESSION: Mild osteoarthritic spurring of the medial tibial spine and of the
articular margins of the patella. No acute bony abnormality.

## 2018-11-07 ENCOUNTER — Ambulatory Visit: Payer: BLUE CROSS/BLUE SHIELD | Admitting: Family Medicine

## 2018-12-21 DIAGNOSIS — C50911 Malignant neoplasm of unspecified site of right female breast: Secondary | ICD-10-CM | POA: Diagnosis not present

## 2018-12-21 DIAGNOSIS — Z170 Estrogen receptor positive status [ER+]: Secondary | ICD-10-CM | POA: Diagnosis not present

## 2018-12-21 DIAGNOSIS — M8580 Other specified disorders of bone density and structure, unspecified site: Secondary | ICD-10-CM | POA: Diagnosis not present

## 2019-01-09 DIAGNOSIS — M8588 Other specified disorders of bone density and structure, other site: Secondary | ICD-10-CM | POA: Diagnosis not present

## 2019-01-09 DIAGNOSIS — C50919 Malignant neoplasm of unspecified site of unspecified female breast: Secondary | ICD-10-CM | POA: Diagnosis not present

## 2019-01-23 DIAGNOSIS — L821 Other seborrheic keratosis: Secondary | ICD-10-CM | POA: Diagnosis not present

## 2019-01-23 DIAGNOSIS — L820 Inflamed seborrheic keratosis: Secondary | ICD-10-CM | POA: Diagnosis not present

## 2019-04-19 ENCOUNTER — Other Ambulatory Visit: Payer: Self-pay | Admitting: *Deleted

## 2019-04-19 DIAGNOSIS — Z20822 Contact with and (suspected) exposure to covid-19: Secondary | ICD-10-CM

## 2019-04-20 LAB — NOVEL CORONAVIRUS, NAA: SARS-CoV-2, NAA: NOT DETECTED

## 2019-07-12 DIAGNOSIS — C50811 Malignant neoplasm of overlapping sites of right female breast: Secondary | ICD-10-CM | POA: Diagnosis not present

## 2019-07-12 DIAGNOSIS — C50919 Malignant neoplasm of unspecified site of unspecified female breast: Secondary | ICD-10-CM | POA: Diagnosis not present

## 2019-07-12 DIAGNOSIS — Z170 Estrogen receptor positive status [ER+]: Secondary | ICD-10-CM | POA: Diagnosis not present

## 2019-08-20 DIAGNOSIS — Z23 Encounter for immunization: Secondary | ICD-10-CM | POA: Diagnosis not present

## 2019-09-18 DIAGNOSIS — Z23 Encounter for immunization: Secondary | ICD-10-CM | POA: Diagnosis not present

## 2019-10-17 ENCOUNTER — Ambulatory Visit: Payer: BLUE CROSS/BLUE SHIELD | Admitting: Family Medicine

## 2020-04-03 ENCOUNTER — Other Ambulatory Visit: Payer: Self-pay

## 2020-04-03 NOTE — Telephone Encounter (Signed)
Patient has not had this medication refilled since 03/20. She will have to wait for an appointment to be seen to get filled. She has not been seen in over a year as well.

## 2020-04-03 NOTE — Telephone Encounter (Signed)
Lmtcb.

## 2020-04-03 NOTE — Telephone Encounter (Signed)
  Prescription Request  04/03/2020  What is the name of the medication or equipment? Lisinopril Pt has appt nov 1 Have you contacted your pharmacy to request a refill? (if applicable) no  Which pharmacy would you [facility] this sent to? [facility]   Patient notified that their request is being sent to the clinical staff for review and that they should receive a response within 2 business days.

## 2020-04-14 ENCOUNTER — Ambulatory Visit (INDEPENDENT_AMBULATORY_CARE_PROVIDER_SITE_OTHER): Payer: BC Managed Care – PPO | Admitting: Nurse Practitioner

## 2020-04-14 ENCOUNTER — Other Ambulatory Visit: Payer: Self-pay

## 2020-04-14 ENCOUNTER — Encounter: Payer: Self-pay | Admitting: Nurse Practitioner

## 2020-04-14 VITALS — BP 124/83 | HR 88 | Temp 97.7°F | Resp 20 | Ht 65.0 in | Wt 211.0 lb

## 2020-04-14 DIAGNOSIS — I10 Essential (primary) hypertension: Secondary | ICD-10-CM | POA: Diagnosis not present

## 2020-04-14 DIAGNOSIS — K219 Gastro-esophageal reflux disease without esophagitis: Secondary | ICD-10-CM

## 2020-04-14 DIAGNOSIS — Z0001 Encounter for general adult medical examination with abnormal findings: Secondary | ICD-10-CM

## 2020-04-14 DIAGNOSIS — Z0000 Encounter for general adult medical examination without abnormal findings: Secondary | ICD-10-CM | POA: Insufficient documentation

## 2020-04-14 MED ORDER — PANTOPRAZOLE SODIUM 40 MG PO TBEC: 40.0000 mg | DELAYED_RELEASE_TABLET | Freq: Every day | ORAL | 1 refills | Status: DC

## 2020-04-14 MED ORDER — LISINOPRIL 10 MG PO TABS: 10.0000 mg | ORAL_TABLET | Freq: Every day | ORAL | 1 refills | Status: DC

## 2020-04-14 NOTE — Assessment & Plan Note (Signed)
Patient's gastroesophageal reflux disease well managed on current medication.  Patient is on Protonix 40 mg tablet daily.  No changes to dose.  Provided education with printed handouts given.  Advised patient to continue healthy diet and exercise regimen.  Follow-up as needed

## 2020-04-14 NOTE — Assessment & Plan Note (Signed)
Patient is a 64 year old female who presents to clinic for an annual physical exam.  Completed head to toe assessment.  Provided education to patient for preventative health with health maintenance.  Printed handouts given.  Completed lab-CBC, CMP, lipid panel, results pending.  Follow-up in 1 year

## 2020-04-14 NOTE — Patient Instructions (Signed)
Hypertension, Adult Hypertension is another name for high blood pressure. High blood pressure forces your heart to work harder to pump blood. This can cause problems over time. There are two numbers in a blood pressure reading. There is a top number (systolic) over a bottom number (diastolic). It is best to have a blood pressure that is below 120/80. Healthy choices can help lower your blood pressure, or you may need medicine to help lower it. What are the causes? The cause of this condition is not known. Some conditions may be related to high blood pressure. What increases the risk?  Smoking.  Having type 2 diabetes mellitus, high cholesterol, or both.  Not getting enough exercise or physical activity.  Being overweight.  Having too much fat, sugar, calories, or salt (sodium) in your diet.  Drinking too much alcohol.  Having long-term (chronic) kidney disease.  Having a family history of high blood pressure.  Age. Risk increases with age.  Race. You may be at higher risk if you are African American.  Gender. Men are at higher risk than women before age 85. After age 20, women are at higher risk than men.  Having obstructive sleep apnea.  Stress. What are the signs or symptoms?  High blood pressure may not cause symptoms. Very high blood pressure (hypertensive crisis) may cause: ? Headache. ? Feelings of worry or nervousness (anxiety). ? Shortness of breath. ? Nosebleed. ? A feeling of being sick to your stomach (nausea). ? Throwing up (vomiting). ? Changes in how you see. ? Very bad chest pain. ? Seizures. How is this treated?  This condition is treated by making healthy lifestyle changes, such as: ? Eating healthy foods. ? Exercising more. ? Drinking less alcohol.  Your health care provider may prescribe medicine if lifestyle changes are not enough to get your blood pressure under control, and if: ? Your top number is above 130. ? Your bottom number is above  80.  Your personal target blood pressure may vary. Follow these instructions at home: Eating and drinking   If told, follow the DASH eating plan. To follow this plan: ? Fill one half of your plate at each meal with fruits and vegetables. ? Fill one fourth of your plate at each meal with whole grains. Whole grains include whole-wheat pasta, brown rice, and whole-grain bread. ? Eat or drink low-fat dairy products, such as skim milk or low-fat yogurt. ? Fill one fourth of your plate at each meal with low-fat (lean) proteins. Low-fat proteins include fish, chicken without skin, eggs, beans, and tofu. ? Avoid fatty meat, cured and processed meat, or chicken with skin. ? Avoid pre-made or processed food.  Eat less than 1,500 mg of salt each day.  Do not drink alcohol if: ? Your doctor tells you not to drink. ? You are pregnant, may be pregnant, or are planning to become pregnant.  If you drink alcohol: ? Limit how much you use to:  0-1 drink a day for women.  0-2 drinks a day for men. ? Be aware of how much alcohol is in your drink. In the [place] one drink equals one 12 oz bottle of beer (355 mL), one 5 oz glass of wine (148 mL), or one 1 oz glass of hard liquor (44 mL). Lifestyle   Work with your doctor to stay at a healthy weight or to lose weight. Ask your doctor what the best weight is for you.  Get at least 30 minutes of exercise most  days of the week. This may include walking, swimming, or biking.  Get at least 30 minutes of exercise that strengthens your muscles (resistance exercise) at least 3 days a week. This may include lifting weights or doing Pilates.  Do not use any products that contain nicotine or tobacco, such as cigarettes, e-cigarettes, and chewing tobacco. If you need help quitting, ask your doctor.  Check your blood pressure at home as told by your doctor.  Keep all follow-up visits as told by your doctor. This is important. Medicines  Take over-the-counter  and prescription medicines only as told by your doctor. Follow directions carefully.  Do not skip doses of blood pressure medicine. The medicine does not work as well if you skip doses. Skipping doses also puts you at risk for problems.  Ask your doctor about side effects or reactions to medicines that you should watch for. Contact a doctor if you:  Think you are having a reaction to the medicine you are taking.  Have headaches that keep coming back (recurring).  Feel dizzy.  Have swelling in your ankles.  Have trouble with your vision. Get help right away if you:  Get a very bad headache.  Start to feel mixed up (confused).  Feel weak or numb.  Feel faint.  Have very bad pain in your: ? Chest. ? Belly (abdomen).  Throw up more than once.  Have trouble breathing. Summary  Hypertension is another name for high blood pressure.  High blood pressure forces your heart to work harder to pump blood.  For most people, a normal blood pressure is less than 120/80.  Making healthy choices can help lower blood pressure. If your blood pressure does not get lower with healthy choices, you may need to take medicine. This information is not intended to replace advice given to you by your health care provider. Make sure you discuss any questions you have with your health care provider. Document Revised: 02/08/2018 Document Reviewed: 02/08/2018 Elsevier Patient Education  2020 [place] Maintenance, Female Adopting a healthy lifestyle and getting preventive care are important in promoting health and wellness. Ask your health care provider about:  The right schedule for you to have regular tests and exams.  Things you can do on your own to prevent diseases and keep yourself healthy. What should I know about diet, weight, and exercise? Eat a healthy diet   Eat a diet that includes plenty of vegetables, fruits, low-fat dairy products, and lean protein.  Do not eat a  lot of foods that are high in solid fats, added sugars, or sodium. Maintain a healthy weight Body mass index (BMI) is used to identify weight problems. It estimates body fat based on height and weight. Your health care provider can help determine your BMI and help you achieve or maintain a healthy weight. Get regular exercise Get regular exercise. This is one of the most important things you can do for your health. Most adults should:  Exercise for at least 150 minutes each week. The exercise should increase your heart rate and make you sweat (moderate-intensity exercise).  Do strengthening exercises at least twice a week. This is in addition to the moderate-intensity exercise.  Spend less time sitting. Even light physical activity can be beneficial. Watch cholesterol and blood lipids Have your blood tested for lipids and cholesterol at 62 years of age, then have this test every 5 years. Have your cholesterol levels checked more often if:  Your lipid or cholesterol levels are  high.  You are older than 62 years of age.  You are at high risk for heart disease. What should I know about cancer screening? Depending on your health history and family history, you may need to have cancer screening at various ages. This may include screening for:  Breast cancer.  Cervical cancer.  Colorectal cancer.  Skin cancer.  Lung cancer. What should I know about heart disease, diabetes, and high blood pressure? Blood pressure and heart disease  High blood pressure causes heart disease and increases the risk of stroke. This is more likely to develop in people who have high blood pressure readings, are of African descent, or are overweight.  Have your blood pressure checked: ? Every 3-5 years if you are 90-52 years of age. ? Every year if you are 29 years old or older. Diabetes Have regular diabetes screenings. This checks your fasting blood sugar level. Have the screening done:  Once every  three years after age 102 if you are at a normal weight and have a low risk for diabetes.  More often and at a younger age if you are overweight or have a high risk for diabetes. What should I know about preventing infection? Hepatitis B If you have a higher risk for hepatitis B, you should be screened for this virus. Talk with your health care provider to find out if you are at risk for hepatitis B infection. Hepatitis C Testing is recommended for:  Everyone born from [birth year] through 1963.  Anyone with known risk factors for hepatitis C. Sexually transmitted infections (STIs)  Get screened for STIs, including gonorrhea and chlamydia, if: ? You are sexually active and are younger than 62 years of age. ? You are older than 62 years of age and your health care provider tells you that you are at risk for this type of infection. ? Your sexual activity has changed since you were last screened, and you are at increased risk for chlamydia or gonorrhea. Ask your health care provider if you are at risk.  Ask your health care provider about whether you are at high risk for HIV. Your health care provider may recommend a prescription medicine to help prevent HIV infection. If you choose to take medicine to prevent HIV, you should first get tested for HIV. You should then be tested every 3 months for as long as you are taking the medicine. Pregnancy  If you are about to stop having your period (premenopausal) and you may become pregnant, seek counseling before you get pregnant.  Take 400 to 800 micrograms (mcg) of folic acid every day if you become pregnant.  Ask for birth control (contraception) if you want to prevent pregnancy. Osteoporosis and menopause Osteoporosis is a disease in which the bones lose minerals and strength with aging. This can result in bone fractures. If you are 41 years old or older, or if you are at risk for osteoporosis and fractures, ask your health care provider if you  should:  Be screened for bone loss.  Take a calcium or vitamin D supplement to lower your risk of fractures.  Be given hormone replacement therapy (HRT) to treat symptoms of menopause. Follow these instructions at home: Lifestyle  Do not use any products that contain nicotine or tobacco, such as cigarettes, e-cigarettes, and chewing tobacco. If you need help quitting, ask your health care provider.  Do not use street drugs.  Do not share needles.  Ask your health care provider for help if you  need support or information about quitting drugs. Alcohol use  Do not drink alcohol if: ? Your health care provider tells you not to drink. ? You are pregnant, may be pregnant, or are planning to become pregnant.  If you drink alcohol: ? Limit how much you use to 0-1 drink a day. ? Limit intake if you are breastfeeding.  Be aware of how much alcohol is in your drink. In the [place] one drink equals one 12 oz bottle of beer (355 mL), one 5 oz glass of wine (148 mL), or one 1 oz glass of hard liquor (44 mL). General instructions  Schedule regular health, dental, and eye exams.  Stay current with your vaccines.  Tell your health care provider if: ? You often feel depressed. ? You have ever been abused or do not feel safe at home. Summary  Adopting a healthy lifestyle and getting preventive care are important in promoting health and wellness.  Follow your health care provider's instructions about healthy diet, exercising, and getting tested or screened for diseases.  Follow your health care provider's instructions on monitoring your cholesterol and blood pressure. This information is not intended to replace advice given to you by your health care provider. Make sure you discuss any questions you have with your health care provider. Document Revised: 05/24/2018 Document Reviewed: 05/24/2018 Elsevier Patient Education  2020 [employer].

## 2020-04-14 NOTE — Assessment & Plan Note (Signed)
Essential hypertension well managed on current medication no changes to current dose.  Provided education to patient with printed handouts given.  Advised patient to continue to maintain a healthy diet with exercise regimen as tolerated.  Rx refill sent to pharmacy.  Follow-up in 3 months.

## 2020-04-14 NOTE — Progress Notes (Addendum)
Established Patient Office Visit  Subjective:  Patient ID: Erin Hurley, female    DOB: 1955-07-23  Age: 64 y.o. MRN: [id number]  CC:  Chief Complaint  Patient presents with   Annual Exam    no pap    HPI Erin Hurley presents for .   Encounter for general adult medical examination Physical: Patient's last physical exam was 2 year ago .  Weight: Not appropriate for height (BMI greater than 27%) ;  Blood Pressure: Normal (BP less than 120/80) ;  Medical History: Patient history reviewed ; Family history reviewed ;  Allergies Reviewed: No change in current allergies ;  Medications Reviewed: Medications reviewed - no changes ;  Lipids: Normal lipid levels ;  Smoking: Life-long non-smoker ;  Physical Activity: Exercises at least 3 times per week ;  Alcohol/Drug Use: Is a non-drinker ; No illicit drug use ;  Patient is  afflicted from Stress Incontinence and Urge Incontinence  Safety: reviewed ; Patient wears a seat belt, has smoke detectors, has carbon monoxide detectors, does not  wear sunscreen with extended sun exposure. Dental Care: biannual cleanings, brushes and flosses daily. Ophthalmology/Optometry: Annual visit.  Hearing loss: none Vision impairments: wears prescription glasses  Past Medical History:  Diagnosis Date   Cancer ([place]) 2009   breast   Hypertension    Rotator cuff injury    left - tear     Past Surgical History:  Procedure Laterality Date   ABDOMINAL HYSTERECTOMY     BREAST LUMPECTOMY Right    BREAST REDUCTION SURGERY     BREAST SURGERY     CHOLECYSTECTOMY     JOINT REPLACEMENT Left    hip    Family History  Problem Relation Age of Onset   COPD Mother    Cancer Mother        lung   Heart disease Mother    Immunodeficiency Mother    Heart disease Father    Cancer Father        liver cancer    Social History   Socioeconomic History   Marital status: Married    Spouse name: Joneen Boers   Number of children: 2    Years of education: Not on file   Highest education level: Not on file  Occupational History   Not on file  Tobacco Use   Smoking status: Never Smoker   Smokeless tobacco: Never Used  Substance and Sexual Activity   Alcohol use: No   Drug use: No   Sexual activity: Not on file  Other Topics Concern   Not on file  Social History Narrative   Not on file   Social Determinants of Health   Financial Resource Strain:    Difficulty of Paying Living Expenses: Not on file  Food Insecurity:    Worried About Charity fundraiser in the Last Year: Not on file   [employer] of Food in the Last Year: Not on file  Transportation Needs:    Lack of Transportation (Medical): Not on file   Lack of Transportation (Non-Medical): Not on file  Physical Activity:    Days of Exercise per Week: Not on file   Minutes of Exercise per Session: Not on file  Stress:    Feeling of Stress : Not on file  Social Connections:    Frequency of Communication with Friends and Family: Not on file   Frequency of Social Gatherings with Friends and Family: Not on file   Attends Religious Services:  Not on file   Active Member of Clubs or Organizations: Not on file   Attends Club or Organization Meetings: Not on file   Marital Status: Not on file  Intimate Partner Violence:    Fear of Current or Ex-Partner: Not on file   Emotionally Abused: Not on file   Physically Abused: Not on file   Sexually Abused: Not on file    Outpatient Medications Prior to Visit  Medication Sig Dispense Refill   Calcium-Magnesium-Vitamin D (CALCIUM 1200+D3 PO) Take 1 tablet by mouth in the morning and at bedtime.     lisinopril (PRINIVIL,ZESTRIL) 10 MG tablet Take 1 tablet (10 mg total) by mouth daily. (Patient not taking: Reported on 04/14/2020) 90 tablet 0   pantoprazole (PROTONIX) 40 MG tablet Take 1 tablet (40 mg total) by mouth daily. (Patient not taking: Reported on 04/14/2020) 30 tablet 3   exemestane  (AROMASIN) 25 MG tablet Take by mouth.     Multiple Vitamins-Minerals (CENTRUM SILVER) tablet Take 1 tablet by mouth daily.     No facility-administered medications prior to visit.    Allergies  Allergen Reactions   Glucosamine Itching   Morphine Nausea And Vomiting   Shellfish-Derived Products Itching   Iodine Rash   Iodine-131 Rash   Latex Rash   Tape Rash    [Derm] use paper tape please [Derm] use paper tape please    ROS Review of Systems  Neurological: Negative for light-headedness, numbness and headaches.  All other systems reviewed and are negative.     Objective:    Physical Exam Vitals reviewed.  Constitutional:      Appearance: Normal appearance. She is obese.  HENT:     Head: Normocephalic.     Right Ear: External ear normal. There is no impacted cerumen.     Left Ear: External ear normal. There is no impacted cerumen.     Nose: Nose normal.     Mouth/Throat:     Mouth: Mucous membranes are moist.     Pharynx: Oropharynx is clear.  Eyes:     Conjunctiva/sclera: Conjunctivae normal.     Pupils: Pupils are equal, round, and reactive to light.  Cardiovascular:     Rate and Rhythm: Normal rate and regular rhythm.     Pulses: Normal pulses.     Heart sounds: Normal heart sounds.  Pulmonary:     Effort: Pulmonary effort is normal.     Breath sounds: Normal breath sounds.  Abdominal:     General: Bowel sounds are normal.  Musculoskeletal:        General: No swelling or tenderness.     Cervical back: Normal range of motion. No tenderness.  Skin:    General: Skin is warm.  Neurological:     Mental Status: She is alert and oriented to person, place, and time.  Psychiatric:        Mood and Affect: Mood normal.        Behavior: Behavior normal.     BP 124/83    Pulse 88    Temp 97.7 F (36.5 C)    Resp 20    Ht 5\' 5"  (1.651 m)    Wt 211 lb (95.7 kg)    SpO2 98%    BMI 35.11 kg/m  Wt Readings from Last 3 Encounters:  04/14/20 211 lb (95.7 kg)   03/15/18 211 lb 9.6 oz (96 kg)  12/28/17 208 lb 6.4 oz (94.5 kg)     Health Maintenance Due  Topic  Date Due   MAMMOGRAM  09/18/2018    There are no preventive care reminders to display for this patient.  Lab Results  Component Value Date   TSH 2.530 03/15/2018   No results found for: WBC, HGB, HCT, MCV, PLT Lab Results  Component Value Date   NA 141 03/15/2018   K 4.4 03/15/2018   CO2 24 03/15/2018   GLUCOSE 84 03/15/2018   BUN 11 03/15/2018   CREATININE 0.65 03/15/2018   CALCIUM 9.2 03/15/2018   Lab Results  Component Value Date   CHOL 189 12/28/2017   Lab Results  Component Value Date   HDL 52 12/28/2017   Lab Results  Component Value Date   LDLCALC 125 (H) 12/28/2017   Lab Results  Component Value Date   TRIG 60 12/28/2017   Lab Results  Component Value Date   CHOLHDL 3.6 12/28/2017   No results found for: HGBA1C    Assessment & Plan:   Problem List Items Addressed This Visit      Cardiovascular and Mediastinum   Essential hypertension    Essential hypertension well managed on current medication no changes to current dose.  Provided education to patient with printed handouts given.  Advised patient to continue to maintain a healthy diet with exercise regimen as tolerated.  Rx refill sent to pharmacy.  Follow-up in 3 months.      Relevant Medications   lisinopril (ZESTRIL) 10 MG tablet     Digestive   Gastroesophageal reflux disease   Relevant Medications   pantoprazole (PROTONIX) 40 MG tablet     Other   Annual physical exam - Primary    Patient is a 64 year old female who presents to clinic for an annual physical exam.  Completed head to toe assessment.  Provided education to patient for preventative health with health maintenance.  Printed handouts given.  Completed lab-CBC, CMP, lipid panel, results pending.  Follow-up in 1 year       Relevant Orders   Lipid Panel   CBC with Differential   Comprehensive metabolic panel       Meds ordered this encounter  Medications   pantoprazole (PROTONIX) 40 MG tablet    Sig: Take 1 tablet (40 mg total) by mouth daily.    Dispense:  90 tablet    Refill:  1   lisinopril (ZESTRIL) 10 MG tablet    Sig: Take 1 tablet (10 mg total) by mouth daily.    Dispense:  90 tablet    Refill:  1    Follow-up: Return in about 1 year (around 04/14/2021).    Ivy Lynn, NP

## 2020-04-15 LAB — LIPID PANEL
Chol/HDL Ratio: 3.2 ratio (ref 0.0–4.4)
Cholesterol, Total: 210 mg/dL — ABNORMAL HIGH (ref 100–199)
HDL: 65 mg/dL (ref >39)
LDL Chol Calc (NIH): 134 mg/dL — ABNORMAL HIGH (ref 0–99)
Triglycerides: 63 mg/dL (ref 0–149)
VLDL Cholesterol Cal: 11 mg/dL (ref 5–40)

## 2020-04-15 LAB — COMPREHENSIVE METABOLIC PANEL
ALT: 17 IU/L (ref 0–32)
AST: 19 IU/L (ref 0–40)
Albumin/Globulin Ratio: 1.8 (ref 1.2–2.2)
Albumin: 4.2 g/dL (ref 3.8–4.8)
Alkaline Phosphatase: 115 IU/L (ref 44–121)
BUN/Creatinine Ratio: 15 (ref 12–28)
BUN: 11 mg/dL (ref 8–27)
Bilirubin Total: 0.8 mg/dL (ref 0.0–1.2)
CO2: 25 mmol/L (ref 20–29)
Calcium: 9.6 mg/dL (ref 8.7–10.3)
Chloride: 102 mmol/L (ref 96–106)
Creatinine, Ser: 0.75 mg/dL (ref 0.57–1.00)
GFR calc Af Amer: 99 mL/min/{1.73_m2} (ref >59)
GFR calc non Af Amer: 86 mL/min/{1.73_m2} (ref >59)
Globulin, Total: 2.4 g/dL (ref 1.5–4.5)
Glucose: 85 mg/dL (ref 65–99)
Potassium: 4.3 mmol/L (ref 3.5–5.2)
Sodium: 142 mmol/L (ref 134–144)
Total Protein: 6.6 g/dL (ref 6.0–8.5)

## 2020-04-15 LAB — CBC WITH DIFFERENTIAL/PLATELET
Basophils Absolute: 0 10*3/uL (ref 0.0–0.2)
Basos: 0 %
EOS (ABSOLUTE): 0.1 10*3/uL (ref 0.0–0.4)
Eos: 1 %
Hematocrit: 44.9 % (ref 34.0–46.6)
Hemoglobin: 15.1 g/dL (ref 11.1–15.9)
Immature Grans (Abs): 0 10*3/uL (ref 0.0–0.1)
Immature Granulocytes: 0 %
Lymphocytes Absolute: 1.2 10*3/uL (ref 0.7–3.1)
Lymphs: 17 %
MCH: 30.1 pg (ref 26.6–33.0)
MCHC: 33.6 g/dL (ref 31.5–35.7)
MCV: 90 fL (ref 79–97)
Monocytes Absolute: 0.4 10*3/uL (ref 0.1–0.9)
Monocytes: 6 %
Neutrophils Absolute: 5.3 10*3/uL (ref 1.4–7.0)
Neutrophils: 76 %
Platelets: 277 10*3/uL (ref 150–450)
RBC: 5.01 x10E6/uL (ref 3.77–5.28)
RDW: 13 % (ref 11.7–15.4)
WBC: 7 10*3/uL (ref 3.4–10.8)

## 2020-04-16 ENCOUNTER — Other Ambulatory Visit: Payer: Self-pay | Admitting: *Deleted

## 2020-04-16 DIAGNOSIS — E785 Hyperlipidemia, unspecified: Secondary | ICD-10-CM

## 2020-10-06 ENCOUNTER — Other Ambulatory Visit: Payer: Self-pay | Admitting: *Deleted

## 2020-10-06 DIAGNOSIS — I10 Essential (primary) hypertension: Secondary | ICD-10-CM

## 2020-10-06 MED ORDER — LISINOPRIL 10 MG PO TABS: 10.0000 mg | ORAL_TABLET | Freq: Every day | ORAL | 2 refills | Status: DC

## 2021-04-15 ENCOUNTER — Ambulatory Visit: Payer: BC Managed Care – PPO | Admitting: Nurse Practitioner

## 2021-08-09 ENCOUNTER — Other Ambulatory Visit: Payer: Self-pay | Admitting: Nurse Practitioner

## 2021-08-09 DIAGNOSIS — I10 Essential (primary) hypertension: Secondary | ICD-10-CM

## 2021-08-17 LAB — HM MAMMOGRAPHY

## 2021-12-21 ENCOUNTER — Telehealth: Payer: Self-pay | Admitting: Nurse Practitioner

## 2021-12-21 NOTE — Telephone Encounter (Signed)
Please give her a different provider I do not wish to continue care either. I have seen her once in 2021 for a physical, if she was dissatisfied then I won't be able to make her happy. thanks

## 2021-12-21 NOTE — Telephone Encounter (Signed)
Patient unhappy with provider, would like to switch. No preference. Who would be willing to take over care for patient? Please call back.

## 2021-12-22 ENCOUNTER — Ambulatory Visit: Payer: Self-pay | Admitting: Nurse Practitioner

## 2021-12-22 NOTE — Telephone Encounter (Signed)
After asking Erin Hurley to take on pt we received a mychart message from spouse asking if Erin Hurley would see pt. Will forward to [facility] for her response.

## 2021-12-22 NOTE — Telephone Encounter (Signed)
Please call patient.  Erin Hurley is fine with her switching to another provider.   Please find out what pt was unhappy about.   Will need to address with new provider if ok to book pt with new provider. Looks like [facility] was suggested but she wants to know what pt was unhappy about.

## 2021-12-22 NOTE — Telephone Encounter (Signed)
Would be glad  to take her as patient

## 2021-12-22 NOTE — Telephone Encounter (Signed)
Sharyn Lull,  Are you alright to see pt?

## 2021-12-22 NOTE — Telephone Encounter (Signed)
Pt says that she does not feel like Je took time to listen to concerns.

## 2022-01-19 NOTE — Patient Instructions (Incomplete)
Our records indicate that you are due for your annual mammogram/breast imaging. While there is no way to prevent breast cancer, early detection provides the best opportunity for curing it. For women over the age of 40, the American Cancer Society recommends a yearly clinical breast exam and a yearly mammogram. These practices have saved thousands of lives. We need your help to ensure that you are receiving optimal medical care. Please call the imaging location that has done you previous mammograms. Please remember to list us as your primary care. This helps make sure we receive a report and can update your chart.  Below is the contact information for several local breast imaging centers. You may call the location that works best for you, and they will be happy to assistance in making you an appointment. You do not need an order for a regular screening mammogram. However, if you are having any problems or concerns with you breast area, please let your primary care provider know, and appropriate orders will be placed. Please let our office know if you have any questions or concerns. Or if you need information for another imaging center not on this list or outside of the area. We are commented to working with you on your health care journey.   The mobile unit/bus ([facility]) - they come twice a month to our location.  These appointments can be made through our office or by call The [facility] Center  [facility]  [address] [place], [place] [address] Phone [phone]  [facility]iology Department  [address]  [place], [place] [address] [phone]  [facility]  [address] [place], [place] [address] [phone]  [facility] [place]  [address] [place] [address] [phone]  [facility]  [address], [address] [place] [address] [phone]  [facility] [place]  [address] , [place] [address] [phone]  [facility] [address] [place], [place] [address] [phone]  [facility] [address] [place],  [address] [phone]  [facility] [address] [place] [address] [phone]     

## 2022-01-21 ENCOUNTER — Ambulatory Visit: Payer: 59 | Admitting: Nurse Practitioner

## 2022-01-21 ENCOUNTER — Ambulatory Visit (INDEPENDENT_AMBULATORY_CARE_PROVIDER_SITE_OTHER): Payer: 59

## 2022-01-21 ENCOUNTER — Encounter: Payer: Self-pay | Admitting: Nurse Practitioner

## 2022-01-21 ENCOUNTER — Other Ambulatory Visit: Payer: Self-pay | Admitting: Nurse Practitioner

## 2022-01-21 VITALS — BP 138/87 | HR 93 | Temp 97.5°F | Resp 20 | Ht 65.0 in | Wt 217.0 lb

## 2022-01-21 DIAGNOSIS — I10 Essential (primary) hypertension: Secondary | ICD-10-CM | POA: Diagnosis not present

## 2022-01-21 DIAGNOSIS — Z0001 Encounter for general adult medical examination with abnormal findings: Secondary | ICD-10-CM

## 2022-01-21 DIAGNOSIS — Z6835 Body mass index (BMI) 35.0-35.9, adult: Secondary | ICD-10-CM

## 2022-01-21 DIAGNOSIS — Z0000 Encounter for general adult medical examination without abnormal findings: Secondary | ICD-10-CM

## 2022-01-21 DIAGNOSIS — L989 Disorder of the skin and subcutaneous tissue, unspecified: Secondary | ICD-10-CM

## 2022-01-21 DIAGNOSIS — K219 Gastro-esophageal reflux disease without esophagitis: Secondary | ICD-10-CM | POA: Diagnosis not present

## 2022-01-21 DIAGNOSIS — I5189 Other ill-defined heart diseases: Secondary | ICD-10-CM

## 2022-01-21 MED ORDER — LISINOPRIL 10 MG PO TABS: 10.0000 mg | ORAL_TABLET | Freq: Every day | ORAL | 2 refills | Status: DC

## 2022-01-21 MED ORDER — ESOMEPRAZOLE MAGNESIUM 40 MG PO CPDR: 40.0000 mg | DELAYED_RELEASE_CAPSULE | Freq: Every day | ORAL | 1 refills | Status: DC

## 2022-01-21 NOTE — Addendum Note (Signed)
Addended by: Lanier Prude D on: 01/21/2022 08:59 AM   Modules accepted: Orders

## 2022-01-21 NOTE — Telephone Encounter (Signed)
Pharmacy comment: Alternative Requested:NOT COVERED BY INSURANCE.

## 2022-01-21 NOTE — Progress Notes (Signed)
Subjective:    Patient ID: Erin Hurley, female    DOB: 1955/07/14, 66 y.o.   MRN: [id number]   Chief Complaint: medical management of chronic issues     HPI:  Erin Hurley is a 66 y.o. who identifies as a female who was assigned female at birth.   Social history: Lives with: husband Work history: Engineering geologist   Comes in today for follow up of the following chronic medical issues:  1. Essential hypertension No c/o chest pain, sob or headache, does check blood pressure at home. Has been running in 734'L systolic.She has been out of her blood pressure meds for several months  BP Readings from Last 3 Encounters:  01/21/22 138/87  04/14/20 124/83  03/15/18 129/81      2. Gastroesophageal reflux disease without esophagitis Is on protonix daily and is doing well.  3. Diastolic dysfunction Has not seen cardiology in several years according to chart. Has daily lower ext edema. Walks daily with her husband and has SOB despite walking daily for several months.  4. BMI 35.0-35.9,adult No recent weight changes Wt Readings from Last 3 Encounters:  04/14/20 211 lb (95.7 kg)  03/15/18 211 lb 9.6 oz (96 kg)  12/28/17 208 lb 6.4 oz (94.5 kg)   BMI Readings from Last 3 Encounters:  04/14/20 35.11 kg/m  03/15/18 35.21 kg/m  12/28/17 34.68 kg/m      New complaints: History of right breast cancer. Keeps up with mammograms  Allergies  Allergen Reactions   Glucosamine Itching   Morphine Nausea And Vomiting   Shellfish-Derived Products Itching   Iodine Rash   Iodine-131 Rash   Latex Rash   Tape Rash    [Derm] use paper tape please [Derm] use paper tape please   Outpatient Encounter Medications as of 01/21/2022  Medication Sig   Calcium-Magnesium-Vitamin D (CALCIUM 1200+D3 PO) Take 1 tablet by mouth in the morning and at bedtime.   lisinopril (ZESTRIL) 10 MG tablet Take 1 tablet (10 mg total) by mouth daily.   pantoprazole (PROTONIX) 40 MG tablet Take 1  tablet (40 mg total) by mouth daily.   No facility-administered encounter medications on file as of 01/21/2022.    Past Surgical History:  Procedure Laterality Date   ABDOMINAL HYSTERECTOMY     BREAST LUMPECTOMY Right    BREAST REDUCTION SURGERY     BREAST SURGERY     CHOLECYSTECTOMY     JOINT REPLACEMENT Left    hip    Family History  Problem Relation Age of Onset   COPD Mother    Cancer Mother        lung   Heart disease Mother    Immunodeficiency Mother    Heart disease Father    Cancer Father        liver cancer      Controlled substance contract: n/a     Review of Systems  Constitutional:  Negative for diaphoresis.  Eyes:  Negative for pain.  Respiratory:  Negative for shortness of breath.   Cardiovascular:  Negative for chest pain, palpitations and leg swelling.  Gastrointestinal:  Negative for abdominal pain.  Endocrine: Negative for polydipsia.  Skin:  Negative for rash.  Neurological:  Negative for dizziness, weakness and headaches.  Hematological:  Does not bruise/bleed easily.  All other systems reviewed and are negative.      Objective:   Physical Exam Vitals and nursing note reviewed.  Constitutional:      General: She is not in  acute distress.    Appearance: Normal appearance. She is well-developed.  HENT:     Head: Normocephalic.     Right Ear: Tympanic membrane normal.     Left Ear: Tympanic membrane normal.     Nose: Nose normal.     Mouth/Throat:     Mouth: Mucous membranes are moist.  Eyes:     Pupils: Pupils are equal, round, and reactive to light.  Neck:     Vascular: No carotid bruit or JVD.  Cardiovascular:     Rate and Rhythm: Normal rate and regular rhythm.     Heart sounds: Normal heart sounds.  Pulmonary:     Effort: Pulmonary effort is normal. No respiratory distress.     Breath sounds: Normal breath sounds. No wheezing or rales.  Chest:     Chest wall: No tenderness.  Abdominal:     General: Bowel sounds are  normal. There is no distension or abdominal bruit.     Palpations: Abdomen is soft. There is no hepatomegaly, splenomegaly, mass or pulsatile mass.     Tenderness: There is no abdominal tenderness.  Musculoskeletal:        General: Normal range of motion.     Cervical back: Normal range of motion and neck supple.  Lymphadenopathy:     Cervical: No cervical adenopathy.  Skin:    General: Skin is warm and dry.  Neurological:     Mental Status: She is alert and oriented to person, place, and time.     Deep Tendon Reflexes: Reflexes are normal and symmetric.  Psychiatric:        Behavior: Behavior normal.        Thought Content: Thought content normal.        Judgment: Judgment normal.    BP 138/87   Pulse 93   Temp (!) 97.5 F (36.4 C) (Temporal)   Resp 20   Ht '5\' 5"'  (1.651 m)   Wt 217 lb (98.4 kg)   SpO2 95%   BMI 36.11 kg/m    EKG- NSR-Mary-Margaret Hassell Done, FNP      Assessment & Plan:  LANDRI DORSAINVIL comes in today with chief complaint of Medical Management of Chronic Issues   Diagnosis and orders addressed:  1. Annual physical exam - VITAMIN D 25 Hydroxy (Vit-D Deficiency, Fractures) - Thyroid Panel With TSH  2. Essential hypertension Low sodium diet Back on lisinopril as previously order - lisinopril (ZESTRIL) 10 MG tablet; Take 1 tablet (10 mg total) by mouth daily.  Dispense: 90 tablet; Refill: 2 - CBC with Differential/Platelet - CMP14+EGFR - Lipid panel  3. Gastroesophageal reflux disease without esophagitis Avoid spicy foods Do not eat 2 hours prior to bedtime   4. Diastolic dysfunction Referral to cardiology - Ambulatory referral to Cardiology  5. BMI 35.0-35.9,adult Discussed diet and exercise for person with BMI >25 Will recheck weight in 3-6 months   6. Skin lesions Needs full body check - Ambulatory referral to Dermatology   Labs pending Health Maintenance reviewed Diet and exercise encouraged  Follow up plan: 6  months   Mary-Margaret Hassell Done, FNP

## 2022-01-22 LAB — CMP14+EGFR
ALT: 18 IU/L (ref 0–32)
AST: 19 IU/L (ref 0–40)
Albumin/Globulin Ratio: 2.1 (ref 1.2–2.2)
Albumin: 4.2 g/dL (ref 3.9–4.9)
Alkaline Phosphatase: 112 IU/L (ref 44–121)
BUN/Creatinine Ratio: 15 (ref 12–28)
BUN: 12 mg/dL (ref 8–27)
Bilirubin Total: 0.7 mg/dL (ref 0.0–1.2)
CO2: 24 mmol/L (ref 20–29)
Calcium: 9.2 mg/dL (ref 8.7–10.3)
Chloride: 106 mmol/L (ref 96–106)
Creatinine, Ser: 0.82 mg/dL (ref 0.57–1.00)
Globulin, Total: 2 g/dL (ref 1.5–4.5)
Glucose: 90 mg/dL (ref 70–99)
Potassium: 4.6 mmol/L (ref 3.5–5.2)
Sodium: 143 mmol/L (ref 134–144)
Total Protein: 6.2 g/dL (ref 6.0–8.5)
eGFR: 80 mL/min/{1.73_m2} (ref >59)

## 2022-01-22 LAB — THYROID PANEL WITH TSH
Free Thyroxine Index: 2.1 (ref 1.2–4.9)
T3 Uptake Ratio: 27 % (ref 24–39)
T4, Total: 7.7 ug/dL (ref 4.5–12.0)
TSH: 2.43 u[IU]/mL (ref 0.450–4.500)

## 2022-01-22 LAB — CBC WITH DIFFERENTIAL/PLATELET
Basophils Absolute: 0 10*3/uL (ref 0.0–0.2)
Basos: 1 %
EOS (ABSOLUTE): 0.1 10*3/uL (ref 0.0–0.4)
Eos: 2 %
Hematocrit: 43.7 % (ref 34.0–46.6)
Hemoglobin: 15 g/dL (ref 11.1–15.9)
Immature Grans (Abs): 0 10*3/uL (ref 0.0–0.1)
Immature Granulocytes: 0 %
Lymphocytes Absolute: 0.8 10*3/uL (ref 0.7–3.1)
Lymphs: 18 %
MCH: 30.1 pg (ref 26.6–33.0)
MCHC: 34.3 g/dL (ref 31.5–35.7)
MCV: 88 fL (ref 79–97)
Monocytes Absolute: 0.4 10*3/uL (ref 0.1–0.9)
Monocytes: 8 %
Neutrophils Absolute: 3.3 10*3/uL (ref 1.4–7.0)
Neutrophils: 71 %
Platelets: 244 10*3/uL (ref 150–450)
RBC: 4.99 x10E6/uL (ref 3.77–5.28)
RDW: 12.1 % (ref 11.7–15.4)
WBC: 4.6 10*3/uL (ref 3.4–10.8)

## 2022-01-22 LAB — LIPID PANEL
Chol/HDL Ratio: 3.5 ratio (ref 0.0–4.4)
Cholesterol, Total: 197 mg/dL (ref 100–199)
HDL: 56 mg/dL (ref >39)
LDL Chol Calc (NIH): 130 mg/dL — ABNORMAL HIGH (ref 0–99)
Triglycerides: 58 mg/dL (ref 0–149)
VLDL Cholesterol Cal: 11 mg/dL (ref 5–40)

## 2022-01-22 LAB — VITAMIN D 25 HYDROXY (VIT D DEFICIENCY, FRACTURES): Vit D, 25-Hydroxy: 46.4 ng/mL (ref 30.0–100.0)

## 2022-03-07 ENCOUNTER — Telehealth: Payer: 59 | Admitting: Family

## 2022-03-07 DIAGNOSIS — U071 COVID-19: Secondary | ICD-10-CM | POA: Diagnosis not present

## 2022-03-07 MED ORDER — MOLNUPIRAVIR EUA 200MG CAPSULE: 4.0000 | ORAL_CAPSULE | Freq: Two times a day (BID) | ORAL | 0 refills | Status: AC

## 2022-03-07 NOTE — Progress Notes (Signed)
Virtual Visit Consent   Erin Hurley, you are scheduled for a virtual visit with a [facility] provider today. Just as with appointments in the office, your consent must be obtained to participate. Your consent will be active for this visit and any virtual visit you may have with one of our providers in the next 365 days. If you have a MyChart account, a copy of this consent can be sent to you electronically.  As this is a virtual visit, video technology does not allow for your provider to perform a traditional examination. This may limit your provider's ability to fully assess your condition. If your provider identifies any concerns that need to be evaluated in person or the need to arrange testing (such as labs, EKG, etc.), we will make arrangements to do so. Although advances in technology are sophisticated, we cannot ensure that it will always work on either your end or our end. If the connection with a video visit is poor, the visit may have to be switched to a telephone visit. With either a video or telephone visit, we are not always able to ensure that we have a secure connection.  By engaging in this virtual visit, you consent to the provision of healthcare and authorize for your insurance to be billed (if applicable) for the services provided during this visit. Depending on your insurance coverage, you may receive a charge related to this service.  I need to obtain your verbal consent now. Are you willing to proceed with your visit today? Erin Hurley has provided verbal consent on 03/07/2022 for a virtual visit (video or telephone). Evelina Dun, FNP  Date: 03/07/2022 6:13 PM  Virtual Visit via Video Note   I, Evelina Dun, connected with  Erin Hurley  ([id number], Dec 27, 1955) on 03/07/22 at  6:15 PM EDT by a video-enabled telemedicine application and verified that I am speaking with the correct person using two identifiers.  Location: Patient: Virtual Visit Location Patient:  Home Provider: Virtual Visit Location Provider: Home Office   I discussed the limitations of evaluation and management by telemedicine and the availability of in person appointments. The patient expressed understanding and agreed to proceed.    History of Present Illness: Erin Hurley is a 66 y.o. who identifies as a female who was assigned female at birth, and is being seen today for COVID. Reports her symptoms today and tested positive today.   HPI: URI  This is a new problem. The current episode started today. The problem has been unchanged. Associated symptoms include congestion, coughing, headaches, joint pain, sinus pain, sneezing and a sore throat. Pertinent negatives include no ear pain. She has tried acetaminophen for the symptoms. The treatment provided mild relief.    Problems:  Patient Active Problem List   Diagnosis Date Noted   Cholangitis 09/09/2016   Closed fracture of left distal radius 07/12/2016   Essential hypertension 12/29/2015   BMI 35.0-35.9,adult 12/29/2015   Rupture of biceps tendon, traumatic 69/62/9528   Diastolic dysfunction 41/32/4401   Arthritis of right knee 12/26/2014   History of breast reconstruction 08/06/2011   Osteopenia 04/09/2011   Gastroesophageal reflux disease 04/03/2011   Malignant neoplasm of female breast ([place]) 04/03/2011   Onychomycosis 04/03/2011   Osteoarthrosis, unspecified whether generalized or localized, pelvic region and thigh 04/03/2011   Status post left hip replacement 04/03/2011    Allergies:  Allergies  Allergen Reactions   Glucosamine Itching   Morphine Nausea And Vomiting   Shellfish-Derived Products Itching  Iodine Rash   Iodine-131 Rash   Latex Rash   Tape Rash    [Derm] use paper tape please [Derm] use paper tape please   Medications:  Current Outpatient Medications:    molnupiravir EUA (LAGEVRIO) 200 mg CAPS capsule, Take 4 capsules (800 mg total) by mouth 2 (two) times daily for 5 days., Disp: 40  capsule, Rfl: 0   Calcium-Magnesium-Vitamin D (CALCIUM 1200+D3 PO), Take 1 tablet by mouth in the morning and at bedtime. (Patient not taking: Reported on 01/21/2022), Disp: , Rfl:    esomeprazole (NEXIUM) 40 MG capsule, TAKE 1 CAPSULE (40 MG TOTAL) BY MOUTH DAILY AT 12 NOON., Disp: 90 capsule, Rfl: 1   lisinopril (ZESTRIL) 10 MG tablet, Take 1 tablet (10 mg total) by mouth daily., Disp: 90 tablet, Rfl: 2   meloxicam (MOBIC) 15 MG tablet, Take 15 mg by mouth daily. (Patient not taking: Reported on 01/21/2022), Disp: , Rfl:   Observations/Objective: Patient is well-developed, well-nourished in no acute distress.  Resting comfortably  at home.  Head is normocephalic, atraumatic.  No labored breathing.  Speech is clear and coherent with logical content.  Patient is alert and oriented at baseline.  Nasal congestion  Assessment and Plan: 1. COVID-19 - molnupiravir EUA (LAGEVRIO) 200 mg CAPS capsule; Take 4 capsules (800 mg total) by mouth 2 (two) times daily for 5 days.  Dispense: 40 capsule; Refill: 0  COVID positive, rest, force fluids, tylenol as needed, Quarantine for at least 5 days and you are fever free, then must wear a mask out in public from day 7-06, report any worsening symptoms such as increased shortness of breath, swelling, or continued high fevers. Possible adverse effects discussed with antivirals.    Follow Up Instructions: I discussed the assessment and treatment plan with the patient. The patient was provided an opportunity to ask questions and all were answered. The patient agreed with the plan and demonstrated an understanding of the instructions.  A copy of instructions were sent to the patient via MyChart unless otherwise noted below.     The patient was advised to call back or seek an in-person evaluation if the symptoms worsen or if the condition fails to improve as anticipated.  Time:  I spent 6 minutes with the patient via telehealth technology discussing the above  problems/concerns.    Evelina Dun, FNP

## 2022-04-15 ENCOUNTER — Ambulatory Visit: Payer: 59 | Attending: Cardiology | Admitting: Cardiology

## 2022-04-15 ENCOUNTER — Encounter: Payer: Self-pay | Admitting: Cardiology

## 2022-04-15 VITALS — BP 106/72 | HR 86 | Ht 65.0 in | Wt 205.8 lb

## 2022-04-15 DIAGNOSIS — I10 Essential (primary) hypertension: Secondary | ICD-10-CM | POA: Diagnosis not present

## 2022-04-15 DIAGNOSIS — I5189 Other ill-defined heart diseases: Secondary | ICD-10-CM

## 2022-04-15 NOTE — Progress Notes (Signed)
Clinical Summary Erin Hurley is a 66 y.o.female seen today as a new consult, referred by NP Hassell Done for the following medical problems.   Diastolic dysfunction - 3419 [facility] echo: LVEF 55-60%, grade I dd - prior issues with LE edema, improved with weight loss and exercise. Walks 2.5-5 miles per day, without significant symptoms. Down 20 lbs since 01/2022 - no orthopnea.   2.HTN - compliant with meds     Past Medical History:  Diagnosis Date   Cancer ([place]) 2009   breast   Hypertension    Rotator cuff injury    left - tear      Allergies  Allergen Reactions   Glucosamine Itching   Morphine Nausea And Vomiting   Shellfish-Derived Products Itching   Iodine Rash   Iodine-131 Rash   Latex Rash   Tape Rash    [Derm] use paper tape please [Derm] use paper tape please     Current Outpatient Medications  Medication Sig Dispense Refill   Calcium-Magnesium-Vitamin D (CALCIUM 1200+D3 PO) Take 1 tablet by mouth in the morning and at bedtime. (Patient not taking: Reported on 01/21/2022)     esomeprazole (NEXIUM) 40 MG capsule TAKE 1 CAPSULE (40 MG TOTAL) BY MOUTH DAILY AT 12 NOON. 90 capsule 1   lisinopril (ZESTRIL) 10 MG tablet Take 1 tablet (10 mg total) by mouth daily. 90 tablet 2   meloxicam (MOBIC) 15 MG tablet Take 15 mg by mouth daily. (Patient not taking: Reported on 01/21/2022)     No current facility-administered medications for this visit.     Past Surgical History:  Procedure Laterality Date   ABDOMINAL HYSTERECTOMY     BREAST LUMPECTOMY Right    BREAST REDUCTION SURGERY     BREAST SURGERY     CHOLECYSTECTOMY     JOINT REPLACEMENT Left    hip     Allergies  Allergen Reactions   Glucosamine Itching   Morphine Nausea And Vomiting   Shellfish-Derived Products Itching   Iodine Rash   Iodine-131 Rash   Latex Rash   Tape Rash    [Derm] use paper tape please [Derm] use paper tape please      Family History  Problem Relation Age of Onset    COPD Mother    Cancer Mother        lung   Heart disease Mother    Immunodeficiency Mother    Heart disease Father    Cancer Father        liver cancer     Social History Erin Hurley reports that she has never smoked. She has never used smokeless tobacco. Erin Hurley reports no history of alcohol use.   Review of Systems CONSTITUTIONAL: No weight loss, fever, chills, weakness or fatigue.  HEENT: Eyes: No visual loss, blurred vision, double vision or yellow sclerae.No hearing loss, sneezing, congestion, runny nose or sore throat.  SKIN: No rash or itching.  CARDIOVASCULAR: per hpi RESPIRATORY: No shortness of breath, cough or sputum.  GASTROINTESTINAL: No anorexia, nausea, vomiting or diarrhea. No abdominal pain or blood.  GENITOURINARY: No burning on urination, no polyuria NEUROLOGICAL: No headache, dizziness, syncope, paralysis, ataxia, numbness or tingling in the extremities. No change in bowel or bladder control.  MUSCULOSKELETAL: No muscle, back pain, joint pain or stiffness.  LYMPHATICS: No enlarged nodes. No history of splenectomy.  PSYCHIATRIC: No history of depression or anxiety.  ENDOCRINOLOGIC: No reports of sweating, cold or heat intolerance. No polyuria or polydipsia.  Marland Kitchen  Physical Examination Today's Vitals   04/15/22 0859  BP: 106/72  Pulse: 86  SpO2: 95%  Weight: 205 lb 12.8 oz (93.4 kg)  Height: '5\' 5"'$  (1.651 m)   Body mass index is 34.25 kg/m.  Gen: resting comfortably, no acute distress HEENT: no scleral icterus, pupils equal round and reactive, no palptable cervical adenopathy,  CV: RRR, no m/r/g, no jvd Resp: Clear to auscultation bilaterally GI: abdomen is soft, non-tender, non-distended, normal bowel sounds, no hepatosplenomegaly MSK: extremities are warm, no edema.  Skin: warm, no rash Neuro:  no focal deficits Psych: appropriate affect      Assessment and Plan  1.Diastolic dysfunction - mild diastolic dysfunction noted by 2017 echo - no  recent symptoms. Prior LE edema resolved with 20 lbs weight loss and increased exercise, walking several miles on a regular basis without exertional symptoms - no indication for further testing at this time. If recurrent edema or development of SOB/DOE could repeat echo  2. HTN - at goal, continue current meds Can f/u with cardiology just as needed      Arnoldo Lenis, M.D., F.A.C.C.

## 2022-04-15 NOTE — Patient Instructions (Signed)
Medication Instructions:  Your physician recommends that you continue on your current medications as directed. Please refer to the Current Medication list given to you today.   Labwork: None  Testing/Procedures: None  Follow-Up: Follow up as needed with Dr. Harl Bowie  Any Other Special Instructions Will Be Listed Below (If Applicable).     If you need a refill on your cardiac medications before your next appointment, please call your pharmacy.

## 2022-04-22 ENCOUNTER — Ambulatory Visit: Payer: 59 | Admitting: Nurse Practitioner

## 2022-04-22 ENCOUNTER — Encounter: Payer: Self-pay | Admitting: Nurse Practitioner

## 2022-04-22 VITALS — BP 129/80 | HR 82 | Temp 98.1°F | Resp 20 | Ht 65.0 in | Wt 205.0 lb

## 2022-04-22 DIAGNOSIS — R300 Dysuria: Secondary | ICD-10-CM

## 2022-04-22 DIAGNOSIS — N3941 Urge incontinence: Secondary | ICD-10-CM | POA: Diagnosis not present

## 2022-04-22 LAB — URINALYSIS, COMPLETE
Bilirubin, UA: NEGATIVE
Glucose, UA: NEGATIVE
Leukocytes,UA: NEGATIVE
Nitrite, UA: NEGATIVE
Protein,UA: NEGATIVE
Specific Gravity, UA: 1.02 (ref 1.005–1.030)
Urobilinogen, Ur: 0.2 mg/dL (ref 0.2–1.0)
pH, UA: 6 (ref 5.0–7.5)

## 2022-04-22 LAB — MICROSCOPIC EXAMINATION
RBC, Urine: NONE SEEN /hpf (ref 0–2)
Renal Epithel, UA: NONE SEEN /hpf

## 2022-04-22 NOTE — Patient Instructions (Signed)
Urinary Incontinence Urinary incontinence refers to a condition in which a person is unable to control where and when to pass urine. A person with this condition will urinate involuntarily. This means that the person urinates when he or she does not mean to. What are the causes? This condition may be caused by: Medicines. Infections. Constipation. Overactive bladder muscles. Weak bladder muscles. Weak pelvic floor muscles. These muscles provide support for the bladder, intestine, and, in women, the uterus. Enlarged prostate in men. The prostate is a gland near the bladder. When it gets too big, it can pinch the urethra. With the urethra blocked, the bladder can weaken and lose the ability to empty properly. Surgery. Emotional factors, such as anxiety, stress, or post-traumatic stress disorder (PTSD). Spinal cord injury, nerve injury, or other neurological conditions. Pelvic organ prolapse. This happens in women when organs move out of place and into the vagina. This movement can prevent the bladder and urethra from working properly. What increases the risk? The following factors may make you more likely to develop this condition: Age. The older you are, the higher the risk. Obesity. Being physically inactive. Pregnancy and childbirth. Menopause. Diseases that affect the nerves or spinal cord. Long-term, or chronic, coughing. This can increase pressure on the bladder and pelvic floor muscles. What are the signs or symptoms? Symptoms may vary depending on the type of urinary incontinence you have. They include: A sudden urge to urinate, and passing urine involuntarily before you can get to a bathroom (urge incontinence). Suddenly passing urine when doing activities that force urine to pass, such as coughing, laughing, exercising, or sneezing (stress incontinence). Needing to urinate often but urinating only a small amount, or constantly dribbling urine (overflow incontinence). Urinating  because you cannot get to the bathroom in time due to a physical disability, such as arthritis or injury, or due to a communication or thinking problem, such as Alzheimer's disease (functional incontinence). How is this diagnosed? This condition may be diagnosed based on: Your medical history. A physical exam. Tests, such as: Urine tests. X-rays of your kidney and bladder. Ultrasound. CT scan. Cystoscopy. In this procedure, a health care provider inserts a tube with a light and camera (cystoscope) through the urethra and into the bladder to check for problems. Urodynamic testing. These tests assess how well the bladder, urethra, and sphincter can store and release urine. There are different types of urodynamic tests, and they vary depending on what the test is measuring. To help diagnose your condition, your health care provider may recommend that you keep a log of when you urinate and how much you urinate. How is this treated? Treatment for this condition depends on the type of incontinence that you have and its cause. Treatment may include: Lifestyle changes, such as: Quitting smoking. Maintaining a healthy weight. Staying active. Try to get 150 minutes of moderate-intensity exercise every week. Ask your health care provider which activities are safe for you. Eating a healthy diet. Avoid high-fat foods, like fried foods. Avoid refined carbohydrates like white bread and white rice. Limit how much alcohol and caffeine you drink. Increase your fiber intake. Healthy sources of fiber include beans, whole grains, and fresh fruits and vegetables. Behavioral changes, such as: Pelvic floor muscle exercises. Bladder training, such as lengthening the amount of time between bathroom breaks, or using the bathroom at regular intervals. Using techniques to suppress bladder urges. This can include distraction techniques or controlled breathing exercises. Medicines, such as: Medicines to relax the  bladder   muscles and prevent bladder spasms. Medicines to help slow or prevent the growth of a man's prostate. Botox injections. These can help relax the bladder muscles. Treatments, such as: Using pulses of electricity to help change bladder reflexes (electrical nerve stimulation). For women, using a medical device to prevent urine leaks. This is a small, tampon-like, disposable device that is inserted into the urethra. Injecting collagen or carbon beads (bulking agents) into the urinary sphincter. These can help thicken tissue and close the bladder opening. Surgery. Follow these instructions at home: Lifestyle Limit alcohol and caffeine. These can fill your bladder quickly and irritate it. Keep yourself clean to help prevent odors and skin damage. Ask your health care provider about special skin creams and cleansers that can protect the skin from urine. Consider wearing pads or adult diapers. Make sure to change them regularly, and always change them right after experiencing incontinence. General instructions Take over-the-counter and prescription medicines only as told by your health care provider. Use the bathroom about every 3-4 hours, even if you do not feel the need to urinate. Try to empty your bladder completely every time. After urinating, wait a minute. Then try to urinate again. Make sure you are in a relaxed position while urinating. If your incontinence is caused by nerve problems, keep a log of the medicines you take and the times you go to the bathroom. Keep all follow-up visits. This is important. Where to find more information National Institute of Diabetes and Digestive and Kidney Diseases: www.niddk.nih.gov American Urology Association: www.urologyhealth.org Contact a health care provider if: You have pain that gets worse. Your incontinence gets worse. Get help right away if: You have a fever or chills. You are unable to urinate. You have redness in your groin area or  down your legs. Summary Urinary incontinence refers to a condition in which a person is unable to control where and when to pass urine. This condition may be caused by medicines, infection, weak bladder muscles, weak pelvic floor muscles, enlargement of the prostate (in men), or surgery. Factors such as older age, obesity, pregnancy and childbirth, menopause, neurological diseases, and chronic coughing may increase your risk for developing this condition. Types of urinary incontinence include urge incontinence, stress incontinence, overflow incontinence, and functional incontinence. This condition is usually treated first with lifestyle and behavioral changes, such as quitting smoking, eating a healthier diet, and doing regular pelvic floor exercises. Other treatment options include medicines, bulking agents, medical devices, electrical nerve stimulation, or surgery. This information is not intended to replace advice given to you by your health care provider. Make sure you discuss any questions you have with your health care provider. Document Revised: 01/04/2020 Document Reviewed: 01/04/2020 Elsevier Patient Education  2023 [employer]  

## 2022-04-22 NOTE — Progress Notes (Signed)
Subjective:    Patient ID: Erin Hurley, female    DOB: 1956/04/09, 66 y.o.   MRN: [id number]   Chief Complaint: Frequent UTI   HPI Patient in c/o frequent UTI. She had on in September and has one now. She had some keflex at home and started taking. She use to have real often and then went 2 years and had known and have returned now. Not sexually active but every 6 months or so.. she doe snot take bubble v=baths.    Review of Systems  Constitutional:  Negative for diaphoresis.  Eyes:  Negative for pain.  Respiratory:  Negative for shortness of breath.   Cardiovascular:  Negative for chest pain, palpitations and leg swelling.  Gastrointestinal:  Negative for abdominal pain.  Endocrine: Negative for polydipsia.  Skin:  Negative for rash.  Neurological:  Negative for dizziness, weakness and headaches.  Hematological:  Does not bruise/bleed easily.  All other systems reviewed and are negative.      Objective:   Physical Exam Vitals and nursing note reviewed.  Constitutional:      General: She is not in acute distress.    Appearance: Normal appearance. She is well-developed.  HENT:     Nose: Nose normal.  Neck:     Thyroid: No thyromegaly.     Vascular: No carotid bruit or JVD.     Trachea: Trachea normal.  Cardiovascular:     Rate and Rhythm: Normal rate and regular rhythm.     Heart sounds: Normal heart sounds. No murmur heard.    No friction rub. No gallop.  Pulmonary:     Effort: Pulmonary effort is normal. No respiratory distress.     Breath sounds: Normal breath sounds. No wheezing or rales.  Chest:     Chest wall: No tenderness.  Abdominal:     General: Bowel sounds are normal. There is no distension or abdominal bruit.     Palpations: Abdomen is soft. There is no hepatomegaly, splenomegaly, mass or pulsatile mass.     Tenderness: There is no abdominal tenderness.  Musculoskeletal:        General: Normal range of motion.     Cervical back: Full passive  range of motion without pain, normal range of motion and neck supple.  Lymphadenopathy:     Cervical: No cervical adenopathy.  Skin:    General: Skin is warm and dry.  Neurological:     Mental Status: She is alert and oriented to person, place, and time.     Deep Tendon Reflexes: Reflexes are normal and symmetric.  Psychiatric:        Behavior: Behavior normal.        Thought Content: Thought content normal.        Judgment: Judgment normal.     BP 129/80   Pulse 82   Temp 98.1 F (36.7 C) (Temporal)   Resp 20   Ht '5\' 5"'$  (1.651 m)   Wt 205 lb (93 kg)   SpO2 98%   BMI 34.11 kg/m        Assessment & Plan:  Erin Hurley in today with chief complaint of Frequent UTI   1. Dysuria Take medication as prescribe Cotton underwear Take shower not bath Cranberry juice, yogurt Force fluids AZO over the counter X2 days Culture pending RTO prn  - Urinalysis, Complete - Urine Culture - Urine Culture  2. Urge incontinence of urine - Ambulatory referral to Urology    The above assessment and  management plan was discussed with the patient. The patient verbalized understanding of and has agreed to the management plan. Patient is aware to call the clinic if symptoms persist or worsen. Patient is aware when to return to the clinic for a follow-up visit. Patient educated on when it is appropriate to go to the emergency department.   Mary-Margaret Hassell Done, FNP

## 2022-04-24 LAB — URINE CULTURE

## 2022-07-26 ENCOUNTER — Encounter: Payer: Self-pay | Admitting: Nurse Practitioner

## 2022-07-26 ENCOUNTER — Ambulatory Visit (INDEPENDENT_AMBULATORY_CARE_PROVIDER_SITE_OTHER): Payer: 59 | Admitting: Nurse Practitioner

## 2022-07-26 VITALS — BP 131/84 | HR 95 | Temp 97.3°F | Resp 20 | Ht 65.0 in | Wt 195.0 lb

## 2022-07-26 DIAGNOSIS — Z23 Encounter for immunization: Secondary | ICD-10-CM | POA: Diagnosis not present

## 2022-07-26 DIAGNOSIS — Z6832 Body mass index (BMI) 32.0-32.9, adult: Secondary | ICD-10-CM

## 2022-07-26 DIAGNOSIS — K219 Gastro-esophageal reflux disease without esophagitis: Secondary | ICD-10-CM | POA: Diagnosis not present

## 2022-07-26 DIAGNOSIS — M8588 Other specified disorders of bone density and structure, other site: Secondary | ICD-10-CM | POA: Diagnosis not present

## 2022-07-26 DIAGNOSIS — I10 Essential (primary) hypertension: Secondary | ICD-10-CM

## 2022-07-26 MED ORDER — LISINOPRIL 10 MG PO TABS: 10.0000 mg | ORAL_TABLET | Freq: Every day | ORAL | 1 refills | Status: DC

## 2022-07-26 MED ORDER — PANTOPRAZOLE SODIUM 20 MG PO TBEC: 20.0000 mg | DELAYED_RELEASE_TABLET | Freq: Every day | ORAL | 1 refills | Status: DC

## 2022-07-26 NOTE — Progress Notes (Signed)
Subjective:    Patient ID: Erin Hurley, female    DOB: [birth year], 67 y.o.   MRN: [id number]  Chief Complaint: No chief complaint on file.    HPI:  Erin Hurley is a 67 y.o. who identifies as a female who was assigned female at birth.   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 Essential hypertension No c/o chest pain, SOB or headache. Doe snot check blood pressure at home. BP Readings from Last 3 Encounters:  04/22/22 129/80  04/15/22 106/72  01/21/22 138/87     2. Gastroesophageal reflux disease without esophagitis Nexium is not helping. She stopped taking it and now she is having symptoms.   3. Osteopenia of lumbar spine No dexscan reports are on chart. She does no weight bearing exercises.  4. BMI 35.0-35.9,adult Weight is down 10lbs Wt Readings from Last 3 Encounters:  07/26/22 195 lb (88.5 kg)  04/22/22 205 lb (93 kg)  04/15/22 205 lb 12.8 oz (93.4 kg)   BMI Readings from Last 3 Encounters:  07/26/22 32.45 kg/m  04/22/22 34.11 kg/m  04/15/22 34.25 kg/m     New complaints: None other then GERD which was discussed above   Allergies  Allergen Reactions   Glucosamine Itching   Morphine Nausea And Vomiting   Shellfish-Derived Products Itching   Iodine Rash   Iodine-131 Rash   Latex Rash   Tape Rash    [Derm] use paper tape please [Derm] use paper tape please   Outpatient Encounter Medications as of 07/26/2022  Medication Sig   Cholecalciferol (VITAMIN D) 50 MCG (2000 UT) tablet Take 2,000 Units by mouth daily.   esomeprazole (NEXIUM) 40 MG capsule TAKE 1 CAPSULE (40 MG TOTAL) BY MOUTH DAILY AT 12 NOON.   lisinopril (ZESTRIL) 10 MG tablet Take 1 tablet (10 mg total) by mouth daily.   No facility-administered encounter medications on file as of 07/26/2022.    Past Surgical History:  Procedure Laterality Date   ABDOMINAL  HYSTERECTOMY     BREAST LUMPECTOMY Right    BREAST REDUCTION SURGERY     BREAST SURGERY     CHOLECYSTECTOMY     JOINT REPLACEMENT Left    hip    Family History  Problem Relation Age of Onset   COPD Mother    Cancer Mother        lung   Heart disease Mother    Immunodeficiency Mother    Heart disease Father    Cancer Father        liver cancer      Controlled substance contract: n/a      Review of Systems  Constitutional:  Negative for diaphoresis.  Eyes:  Negative for pain.  Respiratory:  Negative for shortness of breath.   Cardiovascular:  Negative for chest pain, palpitations and leg swelling.  Gastrointestinal:  Negative for abdominal pain.  Endocrine: Negative for polydipsia.  Skin:  Negative for rash.  Neurological:  Negative for dizziness, weakness and headaches.  Hematological:  Does not bruise/bleed easily.  All other systems reviewed and are negative.      Objective:   Physical Exam Vitals and nursing note reviewed.  Constitutional:      General: She is not in acute distress.    Appearance: Normal appearance. She is well-developed.  HENT:     Head: Normocephalic.     Right Ear: Tympanic membrane normal.  Left Ear: Tympanic membrane normal.     Nose: Nose normal.     Mouth/Throat:     Mouth: Mucous membranes are moist.  Eyes:     Pupils: Pupils are equal, round, and reactive to light.  Neck:     Vascular: No carotid bruit or JVD.  Cardiovascular:     Rate and Rhythm: Normal rate and regular rhythm.     Heart sounds: Normal heart sounds.  Pulmonary:     Effort: Pulmonary effort is normal. No respiratory distress.     Breath sounds: Normal breath sounds. No wheezing or rales.  Chest:     Chest wall: No tenderness.  Abdominal:     General: Bowel sounds are normal. There is no distension or abdominal bruit.     Palpations: Abdomen is soft. There is no hepatomegaly, splenomegaly, mass or pulsatile mass.     Tenderness: There is no abdominal  tenderness.  Musculoskeletal:        General: Normal range of motion.     Cervical back: Normal range of motion and neck supple.  Lymphadenopathy:     Cervical: No cervical adenopathy.  Skin:    General: Skin is warm and dry.  Neurological:     Mental Status: She is alert and oriented to person, place, and time.     Deep Tendon Reflexes: Reflexes are normal and symmetric.  Psychiatric:        Behavior: Behavior normal.        Thought Content: Thought content normal.        Judgment: Judgment normal.    BP 131/84   Pulse 95   Temp (!) 97.3 F (36.3 C) (Temporal)   Resp 20   Ht 5' 5"$  (1.651 m)   Wt 195 lb (88.5 kg)   SpO2 96%   BMI 32.45 kg/m         Assessment & Plan:   Erin Hurley comes in today with chief complaint of Medical Management of Chronic Issues   Diagnosis and orders addressed:  1. Essential hypertension Low sodium diet - lisinopril (ZESTRIL) 10 MG tablet; Take 1 tablet (10 mg total) by mouth daily.  Dispense: 90 tablet; Refill: 1 - CBC with Differential/Platelet - CMP14+EGFR - Lipid panel  2. Gastroesophageal reflux disease without esophagitis Avoid spicy foods Do not eat 2 hours prior to bedtime   3. Osteopenia of lumbar spine Weight bearing exercises Will schedule for dexascan  4. BMI 32.0-32.9,adult Discussed diet and exercise for person with BMI >25 Will recheck weight in 3-6 months    Labs pending Health Maintenance reviewed Diet and exercise encouraged  Follow up plan: 6 months   Erin Hurley Done, FNP

## 2022-07-26 NOTE — Patient Instructions (Signed)

## 2022-07-26 NOTE — Addendum Note (Signed)
Addended by: Rolena Infante on: 07/26/2022 08:34 AM   Modules accepted: Orders

## 2022-07-26 NOTE — Addendum Note (Signed)
Addended by: Chevis Pretty on: 07/26/2022 08:32 AM   Modules accepted: Level of Service

## 2022-07-26 NOTE — Addendum Note (Signed)
Addended by: Rolena Infante on: 07/26/2022 01:43 PM   Modules accepted: Orders

## 2022-10-25 ENCOUNTER — Ambulatory Visit (INDEPENDENT_AMBULATORY_CARE_PROVIDER_SITE_OTHER): Payer: Medicare Other | Admitting: Nurse Practitioner

## 2022-10-25 ENCOUNTER — Encounter: Payer: Self-pay | Admitting: Nurse Practitioner

## 2022-10-25 VITALS — BP 133/86 | HR 84 | Temp 97.2°F | Resp 20 | Ht 65.0 in | Wt 192.0 lb

## 2022-10-25 DIAGNOSIS — Z23 Encounter for immunization: Secondary | ICD-10-CM

## 2022-10-25 DIAGNOSIS — Z0000 Encounter for general adult medical examination without abnormal findings: Secondary | ICD-10-CM

## 2022-10-25 NOTE — Progress Notes (Signed)
Subjective:    Erin Hurley is a 67 y.o. female who presents for a Welcome to Medicare exam.   Review of Systems Review of Systems  Constitutional:  Negative for diaphoresis and weight loss.  Eyes:  Negative for blurred vision, double vision and pain.  Respiratory:  Negative for shortness of breath.   Cardiovascular:  Negative for chest pain, palpitations, orthopnea and leg swelling.  Gastrointestinal:  Negative for abdominal pain.  Skin:  Negative for rash.  Neurological:  Negative for dizziness, sensory change, loss of consciousness, weakness and headaches.  Endo/Heme/Allergies:  Negative for polydipsia. Does not bruise/bleed easily.  Psychiatric/Behavioral:  Negative for memory loss. The patient does not have insomnia.   All other systems reviewed and are negative.    Cardiac Risk Factors include: advanced age (>3men, >48 women);obesity (BMI >30kg/m2);hypertension The 10-year ASCVD risk score (Arnett DK, et al., 2019) is: 7.9%      Objective:    Today's Vitals   10/25/22 1557 10/25/22 1607  BP: 133/86   Pulse: 84   Resp: 20   Temp: (!) 97.2 F (36.2 C)   TempSrc: Oral   SpO2: 94%   Weight: 192 lb (87.1 kg)   Height: 5\' 5"  (1.651 m)   PainSc:  2   Body mass index is 31.95 kg/m.  Medications Outpatient Encounter Medications as of 10/25/2022  Medication Sig   Cholecalciferol (VITAMIN D) 50 MCG (2000 UT) tablet Take 2,000 Units by mouth daily.   lisinopril (ZESTRIL) 10 MG tablet Take 1 tablet (10 mg total) by mouth daily.   pantoprazole (PROTONIX) 20 MG tablet Take 1 tablet (20 mg total) by mouth daily.   No facility-administered encounter medications on file as of 10/25/2022.     History: Past Medical History:  Diagnosis Date   Cancer (HCC) 2009   breast   Hypertension    Rotator cuff injury    left - tear    Past Surgical History:  Procedure Laterality Date   ABDOMINAL HYSTERECTOMY     BREAST LUMPECTOMY Right    BREAST REDUCTION SURGERY      BREAST SURGERY     CHOLECYSTECTOMY     JOINT REPLACEMENT Left    hip    Family History  Problem Relation Age of Onset   COPD Mother    Cancer Mother        lung   Heart disease Mother    Immunodeficiency Mother    Heart disease Father    Cancer Father        liver cancer   Social History   Occupational History   Not on file  Tobacco Use   Smoking status: Never    Passive exposure: Never   Smokeless tobacco: Never  Substance and Sexual Activity   Alcohol use: No   Drug use: No   Sexual activity: Not on file    Tobacco Counseling Counseling given: Not Answered   Immunizations and Health Maintenance Immunization History  Administered Date(s) Administered   Moderna Sars-Covid-2 Vaccination 08/20/2019, 09/18/2019   Tdap 09/03/2009, 06/15/2019   Zoster Recombinat (Shingrix) 07/26/2022   Health Maintenance Due  Topic Date Due   COVID-19 Vaccine (3 - Moderna risk series) 10/16/2019   Pneumonia Vaccine 42+ Years old (1 of 1 - PCV) Never done   DEXA SCAN  Never done   Zoster Vaccines- Shingrix (2 of 2) 09/20/2022    Activities of Daily Living    10/25/2022    4:17 PM  In your present  state of health, do you have any difficulty performing the following activities:  Hearing? 0  Vision? 0  Difficulty concentrating or making decisions? 0  Walking or climbing stairs? 1  Comment knee pain, balance  Dressing or bathing? 0  Doing errands, shopping? 0  Preparing Food and eating ? N  Using the Toilet? N  In the past six months, have you accidently leaked urine? Y  Do you have problems with loss of bowel control? N  Managing your Medications? N  Managing your Finances? N    Physical Exam  none done(optional), or other factors deemed appropriate based on the beneficiary's medical and social history and current clinical standards.  Advanced Directives: Does Patient Have a Medical Advance Directive?: No Does patient want to make changes to medical advance directive?:  No - Patient declined Would patient like information on creating a medical advance directive?: No - Patient declined    Assessment:    This is a routine wellness examination for this patient . Welcome to medicare  Vision/Hearing screen  Dietary issues and exercise activities discussed:  Current Exercise Habits: Home exercise routine, Type of exercise: walking;calisthenics;strength training/weights, Time (Minutes): 60, Frequency (Times/Week): 5, Weekly Exercise (Minutes/Week): 300, Intensity: Moderate, Exercise limited by: orthopedic condition(s)   Goals      Patient Stated     10/25/2022 AWV Goal: Fall Prevention  Over the next year, patient will decrease their risk for falls by: Using assistive devices, such as a cane or walker, as needed Identifying fall risks within their home and correcting them by: Removing throw rugs Adding handrails to stairs or ramps Removing clutter and keeping a clear pathway throughout the home Increasing light, especially at night Adding shower handles/bars Raising toilet seat Identifying potential personal risk factors for falls: Medication side effects Incontinence/urgency Vestibular dysfunction Hearing loss Musculoskeletal disorders Neurological disorders Orthostatic hypotension         Depression Screen    10/25/2022    4:02 PM 07/26/2022    8:09 AM 04/22/2022    3:15 PM 01/21/2022    8:20 AM  PHQ 2/9 Scores  PHQ - 2 Score 0 0 0 0  PHQ- 9 Score 0 2 5 4      Fall Risk    10/25/2022    4:02 PM  Fall Risk   Falls in the past year? 0    Cognitive Function:    10/25/2022    4:20 PM  MMSE - Mini Mental State Exam  Orientation to time 5  Orientation to Place 5  Registration 3  Attention/ Calculation 5  Recall 3  Language- name 2 objects 2  Language- repeat 1  Language- follow 3 step command 3  Language- read & follow direction 1  Write a sentence 1  Copy design 1  Total score 30        Patient Care Team: Bennie Pierini, FNP as PCP - General (Family Medicine) Wyline Mood, Dorothe Pea, MD as PCP - Cardiology (Cardiology)     Plan:   Welcome to medicare  I have personally reviewed and noted the following in the patient's chart:   Medical and social history Use of alcohol, tobacco or illicit drugs  Current medications and supplements Functional ability and status Nutritional status Physical activity Advanced directives List of other physicians Hospitalizations, surgeries, and ER visits in previous 12 months Vitals Screenings to include cognitive, depression, and falls Referrals and appointments  In addition, I have reviewed and discussed with patient certain preventive protocols, quality metrics,  and best practice recommendations. A written personalized care plan for preventive services as well as general preventive health recommendations were provided to patient.     Mary-Margaret Daphine Deutscher, [place] 10/25/2022

## 2022-10-25 NOTE — Patient Instructions (Signed)

## 2022-10-25 NOTE — Addendum Note (Signed)
Addended by: Quay Burow on: 10/25/2022 04:38 PM   Modules accepted: Orders

## 2022-12-06 ENCOUNTER — Encounter: Payer: Self-pay | Admitting: *Deleted

## 2023-01-24 ENCOUNTER — Ambulatory Visit: Payer: 59 | Admitting: Nurse Practitioner

## 2023-01-27 NOTE — Patient Instructions (Signed)
Our records indicate that you are due for your screening mammogram.  Please call the imaging center that does your yearly mammograms to make an appointment for a mammogram at your earliest convenience. Our office also has a mobile unit through the [facility] that comes to our location. Please call our office if you would like to make an appointment.   

## 2023-01-31 ENCOUNTER — Ambulatory Visit (INDEPENDENT_AMBULATORY_CARE_PROVIDER_SITE_OTHER): Payer: Medicare Other | Admitting: Nurse Practitioner

## 2023-01-31 ENCOUNTER — Ambulatory Visit (INDEPENDENT_AMBULATORY_CARE_PROVIDER_SITE_OTHER): Payer: Medicare Other

## 2023-01-31 ENCOUNTER — Other Ambulatory Visit: Payer: Self-pay | Admitting: Nurse Practitioner

## 2023-01-31 ENCOUNTER — Encounter: Payer: Self-pay | Admitting: Nurse Practitioner

## 2023-01-31 VITALS — BP 128/80 | HR 76 | Temp 97.5°F | Resp 20 | Ht 65.0 in | Wt 189.0 lb

## 2023-01-31 DIAGNOSIS — M8588 Other specified disorders of bone density and structure, other site: Secondary | ICD-10-CM | POA: Diagnosis not present

## 2023-01-31 DIAGNOSIS — Z780 Asymptomatic menopausal state: Secondary | ICD-10-CM

## 2023-01-31 DIAGNOSIS — I10 Essential (primary) hypertension: Secondary | ICD-10-CM

## 2023-01-31 DIAGNOSIS — K219 Gastro-esophageal reflux disease without esophagitis: Secondary | ICD-10-CM | POA: Diagnosis not present

## 2023-01-31 DIAGNOSIS — Z6835 Body mass index (BMI) 35.0-35.9, adult: Secondary | ICD-10-CM

## 2023-01-31 MED ORDER — LISINOPRIL 10 MG PO TABS: 10.0000 mg | ORAL_TABLET | Freq: Every day | ORAL | 1 refills | Status: DC

## 2023-01-31 MED ORDER — VITAMIN D 50 MCG (2000 UT) PO TABS: 2000.0000 [IU] | ORAL_TABLET | Freq: Every day | ORAL | 1 refills | Status: AC

## 2023-01-31 MED ORDER — PANTOPRAZOLE SODIUM 20 MG PO TBEC: 20.0000 mg | DELAYED_RELEASE_TABLET | Freq: Every day | ORAL | 1 refills | Status: DC

## 2023-01-31 NOTE — Progress Notes (Signed)
Subjective:    Patient ID: Erin Hurley, female    DOB: Sep 15, 1955, 67 y.o.   MRN: [id number]   Chief Complaint: medical management of chronic issues     HPI:  Erin Hurley is a 67 y.o. who identifies as a female who was assigned female at birth.   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 Essential hypertension No c/o chest pain, sob or headache. Does not check blood pressure at home. BP Readings from Last 3 Encounters:  10/25/22 133/86  07/26/22 131/84  04/22/22 129/80     2. Gastroesophageal reflux disease without esophagitis Takes protonix and is doing well.  3. Osteopenia of lumbar spine Is due for repeat dexascan today  4. BMI 35.0-35.9,adult Weight is down 3lbs Wt Readings from Last 3 Encounters:  01/31/23 189 lb (85.7 kg)  10/25/22 192 lb (87.1 kg)  07/26/22 195 lb (88.5 kg)   BMI Readings from Last 3 Encounters:  01/31/23 31.45 kg/m  10/25/22 31.95 kg/m  07/26/22 32.45 kg/m        New complaints: None today  Allergies  Allergen Reactions   Glucosamine Itching   Morphine Nausea And Vomiting   Shellfish-Derived Products Itching   Iodine Rash   Iodine-131 Rash   Latex Rash   Tape Rash    [Derm] use paper tape please [Derm] use paper tape please   Outpatient Encounter Medications as of 01/31/2023  Medication Sig   Cholecalciferol (VITAMIN D) 50 MCG (2000 UT) tablet Take 2,000 Units by mouth daily.   lisinopril (ZESTRIL) 10 MG tablet Take 1 tablet (10 mg total) by mouth daily.   pantoprazole (PROTONIX) 20 MG tablet Take 1 tablet (20 mg total) by mouth daily.   No facility-administered encounter medications on file as of 01/31/2023.    Past Surgical History:  Procedure Laterality Date   ABDOMINAL HYSTERECTOMY     BREAST LUMPECTOMY Right    BREAST REDUCTION SURGERY     BREAST SURGERY     CHOLECYSTECTOMY      JOINT REPLACEMENT Left    hip    Family History  Problem Relation Age of Onset   COPD Mother    Cancer Mother        lung   Heart disease Mother    Immunodeficiency Mother    Heart disease Father    Cancer Father        liver cancer      Controlled substance contract: n/a     Review of Systems  Constitutional:  Negative for diaphoresis.  Eyes:  Negative for pain.  Respiratory:  Negative for shortness of breath.   Cardiovascular:  Negative for chest pain, palpitations and leg swelling.  Gastrointestinal:  Negative for abdominal pain.  Endocrine: Negative for polydipsia.  Skin:  Negative for rash.  Neurological:  Negative for dizziness, weakness and headaches.  Hematological:  Does not bruise/bleed easily.  All other systems reviewed and are negative.      Objective:   Physical Exam Vitals and nursing note reviewed.  Constitutional:      General: She is not in acute distress.    Appearance: Normal appearance. She is well-developed.  HENT:     Head: Normocephalic.     Right Ear: Tympanic membrane normal.     Left Ear: Tympanic membrane normal.     Nose: Nose normal.     Mouth/Throat:  Mouth: Mucous membranes are moist.  Eyes:     Pupils: Pupils are equal, round, and reactive to light.  Neck:     Vascular: No carotid bruit or JVD.  Cardiovascular:     Rate and Rhythm: Normal rate and regular rhythm.     Heart sounds: Normal heart sounds.  Pulmonary:     Effort: Pulmonary effort is normal. No respiratory distress.     Breath sounds: Normal breath sounds. No wheezing or rales.  Chest:     Chest wall: No tenderness.  Abdominal:     General: Bowel sounds are normal. There is no distension or abdominal bruit.     Palpations: Abdomen is soft. There is no hepatomegaly, splenomegaly, mass or pulsatile mass.     Tenderness: There is no abdominal tenderness.  Musculoskeletal:        General: Normal range of motion.     Cervical back: Normal range of motion and  neck supple.  Lymphadenopathy:     Cervical: No cervical adenopathy.  Skin:    General: Skin is warm and dry.  Neurological:     Mental Status: She is alert and oriented to person, place, and time.     Deep Tendon Reflexes: Reflexes are normal and symmetric.  Psychiatric:        Behavior: Behavior normal.        Thought Content: Thought content normal.        Judgment: Judgment normal.    BP 128/80   Pulse 76   Temp (!) 97.5 F (36.4 C) (Temporal)   Resp 20   Ht 5\' 5"  (1.651 m)   Wt 189 lb (85.7 kg)   SpO2 96%   BMI 31.45 kg/m         Assessment & Plan:   Erin Hurley comes in today with chief complaint of Medical Management of Chronic Issues   Diagnosis and orders addressed:  1. Essential hypertension Low sodium diet - lisinopril (ZESTRIL) 10 MG tablet; Take 1 tablet (10 mg total) by mouth daily.  Dispense: 90 tablet; Refill: 1  2. Gastroesophageal reflux disease without esophagitis Avoid spicy foods Do not eat 2 hours prior to bedtime - pantoprazole (PROTONIX) 20 MG tablet; Take 1 tablet (20 mg total) by mouth daily.  Dispense: 90 tablet; Refill: 1  3. Osteopenia of lumbar spine Weight bearing exercises Dexascan repeated today - Cholecalciferol (VITAMIN D) 50 MCG (2000 UT) tablet; Take 1 tablet (2,000 Units total) by mouth daily.  Dispense: 90 tablet; Refill: 1  4. BMI 35.0-35.9,adult Discussed diet and exercise for person with BMI >25 Will recheck weight in 3-6 months    Labs pending Health Maintenance reviewed Diet and exercise encouraged  Follow up plan: 6 months    Mary-Margaret Daphine Deutscher, FNP

## 2023-01-31 NOTE — Addendum Note (Signed)
Addended by: Cleda Daub on: 01/31/2023 10:07 AM   Modules accepted: Orders

## 2023-02-01 ENCOUNTER — Other Ambulatory Visit: Payer: Self-pay

## 2023-02-01 ENCOUNTER — Telehealth: Payer: Self-pay | Admitting: Nurse Practitioner

## 2023-02-01 DIAGNOSIS — R7989 Other specified abnormal findings of blood chemistry: Secondary | ICD-10-CM

## 2023-02-01 LAB — CMP14+EGFR
ALT: 16 IU/L (ref 0–32)
AST: 17 IU/L (ref 0–40)
Albumin: 3.9 g/dL (ref 3.9–4.9)
Alkaline Phosphatase: 117 IU/L (ref 44–121)
BUN/Creatinine Ratio: 23 (ref 12–28)
BUN: 16 mg/dL (ref 8–27)
Bilirubin Total: 0.6 mg/dL (ref 0.0–1.2)
CO2: 24 mmol/L (ref 20–29)
Calcium: 8.8 mg/dL (ref 8.7–10.3)
Chloride: 104 mmol/L (ref 96–106)
Creatinine, Ser: 0.71 mg/dL (ref 0.57–1.00)
Globulin, Total: 1.9 g/dL (ref 1.5–4.5)
Glucose: 89 mg/dL (ref 70–99)
Potassium: 4.6 mmol/L (ref 3.5–5.2)
Sodium: 141 mmol/L (ref 134–144)
Total Protein: 5.8 g/dL — ABNORMAL LOW (ref 6.0–8.5)
eGFR: 94 mL/min/{1.73_m2} (ref >59)

## 2023-02-01 LAB — CBC WITH DIFFERENTIAL/PLATELET
Basophils Absolute: 0.1 10*3/uL (ref 0.0–0.2)
Basos: 1 %
EOS (ABSOLUTE): 0.1 10*3/uL (ref 0.0–0.4)
Eos: 1 %
Hematocrit: 42.3 % (ref 34.0–46.6)
Hemoglobin: 14.3 g/dL (ref 11.1–15.9)
Immature Grans (Abs): 0 10*3/uL (ref 0.0–0.1)
Immature Granulocytes: 0 %
Lymphocytes Absolute: 1 10*3/uL (ref 0.7–3.1)
Lymphs: 17 %
MCH: 30.6 pg (ref 26.6–33.0)
MCHC: 33.8 g/dL (ref 31.5–35.7)
MCV: 90 fL (ref 79–97)
Monocytes Absolute: 0.4 10*3/uL (ref 0.1–0.9)
Monocytes: 8 %
Neutrophils Absolute: 4.2 10*3/uL (ref 1.4–7.0)
Neutrophils: 73 %
Platelets: 283 10*3/uL (ref 150–450)
RBC: 4.68 x10E6/uL (ref 3.77–5.28)
RDW: 12 % (ref 11.7–15.4)
WBC: 5.7 10*3/uL (ref 3.4–10.8)

## 2023-02-01 LAB — THYROID PANEL WITH TSH
Free Thyroxine Index: 2.1 (ref 1.2–4.9)
T3 Uptake Ratio: 28 % (ref 24–39)
T4, Total: 7.6 ug/dL (ref 4.5–12.0)
TSH: 1.43 u[IU]/mL (ref 0.450–4.500)

## 2023-02-01 LAB — LIPID PANEL
Chol/HDL Ratio: 3.4 ratio (ref 0.0–4.4)
Cholesterol, Total: 185 mg/dL (ref 100–199)
HDL: 55 mg/dL (ref >39)
LDL Chol Calc (NIH): 120 mg/dL — ABNORMAL HIGH (ref 0–99)
Triglycerides: 54 mg/dL (ref 0–149)
VLDL Cholesterol Cal: 10 mg/dL (ref 5–40)

## 2023-02-01 NOTE — Telephone Encounter (Signed)
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 She states that she eats a lot of protein and she is concerned, Please advise

## 2023-02-01 NOTE — Telephone Encounter (Signed)
Patient notified and verbalized understanding. Future orders placed

## 2023-02-01 NOTE — Telephone Encounter (Signed)
Drink a protein shake daily and recheck in 2 weeks

## 2023-04-11 ENCOUNTER — Encounter: Payer: Self-pay | Admitting: Family Medicine

## 2023-04-11 ENCOUNTER — Ambulatory Visit (INDEPENDENT_AMBULATORY_CARE_PROVIDER_SITE_OTHER): Payer: Medicare Other | Admitting: Family Medicine

## 2023-04-11 VITALS — BP 136/81 | HR 93 | Temp 98.5°F | Ht 65.0 in | Wt 196.0 lb

## 2023-04-11 DIAGNOSIS — S134XXA Sprain of ligaments of cervical spine, initial encounter: Secondary | ICD-10-CM

## 2023-04-11 DIAGNOSIS — M62838 Other muscle spasm: Secondary | ICD-10-CM | POA: Diagnosis not present

## 2023-04-11 MED ORDER — IBUPROFEN 800 MG PO TABS
800.0000 mg | ORAL_TABLET | Freq: Three times a day (TID) | ORAL | 0 refills | Status: AC | PRN
Start: 2023-04-11 — End: ?

## 2023-04-11 MED ORDER — METHOCARBAMOL 500 MG PO TABS
250.0000 mg | ORAL_TABLET | Freq: Three times a day (TID) | ORAL | 0 refills | Status: DC | PRN
Start: 2023-04-11 — End: 2023-07-15

## 2023-04-11 NOTE — Patient Instructions (Signed)
You have prescribed a nonsteroidal anti-inflammatory drug (NSAID) today. This will help with your pain and inflammation. Please do not take any other NSAIDs (ibuprofen/Motrin/Advil, naproxen/Aleve, meloxicam/Mobic, Voltaren/diclofenac). Please make sure to eat a meal when taking this medication.   Caution:  If you have a history of acid reflux/indigestion, I recommend that you take an antacid (such as Prilosec, Prevacid) daily while on the NSAID.  If you have a history of bleeding disorder, gastric ulcer, are on a blood thinner (like warfarin/Coumadin, Xarelto, Eliquis, etc) please do not take NSAID.  If you have ever had a heart attack, you should not take NSAIDs.
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
## 2023-07-15 ENCOUNTER — Encounter: Payer: Self-pay | Admitting: Nurse Practitioner

## 2023-07-15 ENCOUNTER — Ambulatory Visit (INDEPENDENT_AMBULATORY_CARE_PROVIDER_SITE_OTHER): Payer: Medicare Other | Admitting: Nurse Practitioner

## 2023-07-15 VITALS — BP 135/87 | HR 101 | Temp 97.7°F | Ht 65.0 in | Wt 196.0 lb

## 2023-07-15 DIAGNOSIS — I10 Essential (primary) hypertension: Secondary | ICD-10-CM | POA: Diagnosis not present

## 2023-07-15 DIAGNOSIS — R300 Dysuria: Secondary | ICD-10-CM

## 2023-07-15 DIAGNOSIS — N3000 Acute cystitis without hematuria: Secondary | ICD-10-CM

## 2023-07-15 LAB — URINALYSIS, COMPLETE
Bilirubin, UA: NEGATIVE
Glucose, UA: NEGATIVE
Ketones, UA: NEGATIVE
Nitrite, UA: POSITIVE — AB
Specific Gravity, UA: 1.02 (ref 1.005–1.030)
Urobilinogen, Ur: 0.2 mg/dL (ref 0.2–1.0)
pH, UA: 5.5 (ref 5.0–7.5)

## 2023-07-15 LAB — MICROSCOPIC EXAMINATION
Renal Epithel, UA: NONE SEEN /[HPF]
WBC, UA: >30 /[HPF] — AB (ref 0–5)
Yeast, UA: NONE SEEN

## 2023-07-15 MED ORDER — SULFAMETHOXAZOLE-TRIMETHOPRIM 800-160 MG PO TABS
1.0000 | ORAL_TABLET | Freq: Two times a day (BID) | ORAL | 0 refills | Status: DC
Start: 2023-07-15 — End: 2023-08-08

## 2023-07-15 NOTE — Patient Instructions (Signed)
Take medication as prescribe Cotton underwear Take shower not bath Cranberry juice, yogurt Force fluids AZO over the counter X2 days Culture pending RTO prn  

## 2023-07-15 NOTE — Progress Notes (Signed)
Subjective:    Patient ID: Erin Hurley, female    DOB: 04-12-1956, 68 y.o.   MRN: [id number]   Chief Complaint: Dysuria and Hypertension   Patient says she developed a bad headache on Monday. She checked her blood pressure and it was averaging 160 systolic. No blurred vision. Then all the sudden yesterday it went back down and her headache went away.   Hypertension This is a new problem. The current episode started in the past 7 days. The problem has been gradually improving since onset. The problem is controlled. Associated symptoms include headaches. There are no associated agents to hypertension. There are no known risk factors for coronary artery disease. Past treatments include ACE inhibitors. The current treatment provides significant improvement. There are no compliance problems.  There is no history of CAD/MI or CVA.  Dysuria  This is a new problem. The current episode started in the past 7 days. The problem occurs intermittently. The problem has been waxing and waning. The quality of the pain is described as burning. The pain is at a severity of 7/10. The pain is moderate. There has been no fever. She is Sexually active. There is No history of pyelonephritis. Associated symptoms include frequency and urgency. Pertinent negatives include no chills. She has tried nothing for the symptoms. The treatment provided no relief.    Patient Active Problem List   Diagnosis Date Noted   Cholangitis 09/09/2016   Closed fracture of left distal radius 07/12/2016   Essential hypertension 12/29/2015   BMI 35.0-35.9,adult 12/29/2015   Rupture of biceps tendon, traumatic 12/29/2015   Diastolic dysfunction 06/19/2015   Arthritis of right knee 12/26/2014   History of breast reconstruction 08/06/2011   Osteopenia 04/09/2011   Gastroesophageal reflux disease 04/03/2011   Malignant neoplasm of female breast (HCC) 04/03/2011   Onychomycosis 04/03/2011   Osteoarthrosis, unspecified whether  generalized or localized, pelvic region and thigh 04/03/2011   Status post left hip replacement 04/03/2011       Review of Systems  Constitutional:  Negative for chills.  Genitourinary:  Positive for dysuria, frequency and urgency.  Neurological:  Positive for headaches.       Objective:   Physical Exam Cardiovascular:     Rate and Rhythm: Normal rate and regular rhythm.     Heart sounds: Normal heart sounds.  Pulmonary:     Effort: Pulmonary effort is normal.     Breath sounds: Normal breath sounds.  Abdominal:     Tenderness: There is abdominal tenderness (mild suprapubic pressure on palpatiion). There is no right CVA tenderness or left CVA tenderness.  Skin:    General: Skin is warm.  Neurological:     General: No focal deficit present.     Mental Status: She is alert and oriented to person, place, and time.  Psychiatric:        Mood and Affect: Mood normal.        Behavior: Behavior normal.       BP 135/87   Pulse (!) 101   Temp 97.7 F (36.5 C) (Temporal)   Ht 5\' 5"  (1.651 m)   Wt 196 lb (88.9 kg)   SpO2 97%   BMI 32.62 kg/m      Assessment & Plan:   Erin Hurley in today with chief complaint of Dysuria and Hypertension   1. Primary hypertension Dash diet Continue diary of blood pressure  2. Dysuria (Primary) - Urinalysis, Complete - Urine Culture  3. Acute cystitis without hematuria  Take medication as prescribe Cotton underwear Take shower not bath Cranberry juice, yogurt Force fluids AZO over the counter X2 days Culture pending RTO prn  - sulfamethoxazole-trimethoprim (BACTRIM DS) 800-160 MG tablet; Take 1 tablet by mouth 2 (two) times daily.  Dispense: 20 tablet; Refill: 0    The above assessment and management plan was discussed with the patient. The patient verbalized understanding of and has agreed to the management plan. Patient is aware to call the clinic if symptoms persist or worsen. Patient is aware when to return to the  clinic for a follow-up visit. Patient educated on when it is appropriate to go to the emergency department.   Mary-Margaret Daphine Deutscher, FNP

## 2023-07-19 LAB — URINE CULTURE

## 2023-07-27 ENCOUNTER — Other Ambulatory Visit: Payer: Self-pay | Admitting: Nurse Practitioner

## 2023-07-27 DIAGNOSIS — I10 Essential (primary) hypertension: Secondary | ICD-10-CM

## 2023-08-08 ENCOUNTER — Encounter: Payer: Self-pay | Admitting: Nurse Practitioner

## 2023-08-08 ENCOUNTER — Ambulatory Visit (INDEPENDENT_AMBULATORY_CARE_PROVIDER_SITE_OTHER): Payer: Medicare Other | Admitting: Nurse Practitioner

## 2023-08-08 VITALS — BP 127/81 | HR 100 | Temp 97.7°F | Ht 65.0 in | Wt 197.0 lb

## 2023-08-08 DIAGNOSIS — K219 Gastro-esophageal reflux disease without esophagitis: Secondary | ICD-10-CM | POA: Diagnosis not present

## 2023-08-08 DIAGNOSIS — M8588 Other specified disorders of bone density and structure, other site: Secondary | ICD-10-CM | POA: Diagnosis not present

## 2023-08-08 DIAGNOSIS — Z6835 Body mass index (BMI) 35.0-35.9, adult: Secondary | ICD-10-CM

## 2023-08-08 DIAGNOSIS — I10 Essential (primary) hypertension: Secondary | ICD-10-CM

## 2023-08-08 LAB — LIPID PANEL

## 2023-08-08 NOTE — Patient Instructions (Signed)
 Exercising to Stay Healthy To become healthy and stay healthy, it is recommended that you do moderate-intensity and vigorous-intensity exercise. You can tell that you are exercising at a moderate intensity if your heart starts beating faster and you start breathing faster but can still hold a conversation. You can tell that you are exercising at a vigorous intensity if you are breathing much harder and faster and cannot hold a conversation while exercising. How can exercise benefit me? Exercising regularly is important. It has many health benefits, such as: Improving overall fitness, flexibility, and endurance. Increasing bone density. Helping with weight control. Decreasing body fat. Increasing muscle strength and endurance. Reducing stress and tension, anxiety, depression, or anger. Improving overall health. What guidelines should I follow while exercising? Before you start a new exercise program, talk with your health care provider. Do not exercise so much that you hurt yourself, feel dizzy, or get very short of breath. Wear comfortable clothes and wear shoes with good support. Drink plenty of water while you exercise to prevent dehydration or heat stroke. Work out until your breathing and your heartbeat get faster (moderate intensity). How often should I exercise? Choose an activity that you enjoy, and set realistic goals. Your health care provider can help you make an activity plan that is individually designed and works best for you. Exercise regularly as told by your health care provider. This may include: Doing strength training two times a week, such as: Lifting weights. Using resistance bands. Push-ups. Sit-ups. Yoga. Doing a certain intensity of exercise for a given amount of time. Choose from these options: A total of 150 minutes of moderate-intensity exercise every week. A total of 75 minutes of vigorous-intensity exercise every week. A mix of moderate-intensity and  vigorous-intensity exercise every week. Children, pregnant women, people who have not exercised regularly, people who are overweight, and older adults may need to talk with a health care provider about what activities are safe to perform. If you have a medical condition, be sure to talk with your health care provider before you start a new exercise program. What are some exercise ideas? Moderate-intensity exercise ideas include: Walking 1 mile (1.6 km) in about 15 minutes. Biking. Hiking. Golfing. Dancing. Water aerobics. Vigorous-intensity exercise ideas include: Walking 4.5 miles (7.2 km) or more in about 1 hour. Jogging or running 5 miles (8 km) in about 1 hour. Biking 10 miles (16.1 km) or more in about 1 hour. Lap swimming. Roller-skating or in-line skating. Cross-country skiing. Vigorous competitive sports, such as football, basketball, and soccer. Jumping rope. Aerobic dancing. What are some everyday activities that can help me get exercise? Yard work, such as: Child psychotherapist. Raking and bagging leaves. Washing your car. Pushing a stroller. Shoveling snow. Gardening. Washing windows or floors. How can I be more active in my day-to-day activities? Use stairs instead of an elevator. Take a walk during your lunch break. If you drive, park your car farther away from your work or school. If you take public transportation, get off one stop early and walk the rest of the way. Stand up or walk around during all of your indoor phone calls. Get up, stretch, and walk around every 30 minutes throughout the day. Enjoy exercise with a friend. Support to continue exercising will help you keep a regular routine of activity. Where to find more information You can find more information about exercising to stay healthy from: U.[place]. Department of Health and Human Services: ThisPath.fi Centers for Disease Control and Prevention (  CDC): FootballExhibition.com.br Summary Exercising regularly is  important. It will improve your overall fitness, flexibility, and endurance. Regular exercise will also improve your overall health. It can help you control your weight, reduce stress, and improve your bone density. Do not exercise so much that you hurt yourself, feel dizzy, or get very short of breath. Before you start a new exercise program, talk with your health care provider. This information is not intended to replace advice given to you by your health care provider. Make sure you discuss any questions you have with your health care provider. Document Revised: 09/26/2020 Document Reviewed: 09/26/2020 Elsevier Patient Education  2024 [employer].

## 2023-08-08 NOTE — Progress Notes (Addendum)
 Subjective:    Patient ID: Erin Hurley, female    DOB: 1956/05/08, 68 y.o.   MRN: [id number]   Chief Complaint: medical management of chronic issues     HPI:  Erin Hurley is a 68 y.o. who identifies as a female who was assigned female at birth.   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 Essential hypertension No c/o chest pain, sob or headache. Does not check blood pressure at home. BP Readings from Last 3 Encounters:  07/15/23 135/87  04/11/23 136/81  01/31/23 128/80     2. Gastroesophageal reflux disease without esophagitis Takes protonix and is doing well.  3. Osteopenia of lumbar spine Last dexscan was done on 01/21/23. T score was -1.7%  4. BMI 35.0-35.9,adult Weight is down 3lbs  Wt Readings from Last 3 Encounters:  08/08/23 197 lb (89.4 kg)  07/15/23 196 lb (88.9 kg)  04/11/23 196 lb (88.9 kg)   BMI Readings from Last 3 Encounters:  08/08/23 32.78 kg/m  07/15/23 32.62 kg/m  04/11/23 32.62 kg/m         New complaints: None today  Allergies  Allergen Reactions   Glucosamine Itching   Morphine Nausea And Vomiting   Shellfish-Derived Products Itching   Iodine Rash   Iodine-131 Rash   Latex Rash   Tape Rash    [Derm] use paper tape please [Derm] use paper tape please   Outpatient Encounter Medications as of 08/08/2023  Medication Sig   Cholecalciferol (VITAMIN D) 50 MCG (2000 UT) tablet Take 1 tablet (2,000 Units total) by mouth daily.   ibuprofen (ADVIL) 800 MG tablet Take 1 tablet (800 mg total) by mouth every 8 (eight) hours as needed.   lisinopril (ZESTRIL) 10 MG tablet TAKE 1 TABLET BY MOUTH EVERY DAY   pantoprazole (PROTONIX) 20 MG tablet Take 1 tablet (20 mg total) by mouth daily.   sulfamethoxazole-trimethoprim (BACTRIM DS) 800-160 MG tablet Take 1 tablet by mouth 2 (two) times daily.   No  facility-administered encounter medications on file as of 08/08/2023.    Past Surgical History:  Procedure Laterality Date   ABDOMINAL HYSTERECTOMY     BREAST LUMPECTOMY Right    BREAST REDUCTION SURGERY     BREAST SURGERY     CHOLECYSTECTOMY     JOINT REPLACEMENT Left    hip    Family History  Problem Relation Age of Onset   COPD Mother    Cancer Mother        lung   Heart disease Mother    Immunodeficiency Mother    Heart disease Father    Cancer Father        liver cancer      Controlled substance contract: n/a     Review of Systems  Constitutional:  Negative for diaphoresis.  Eyes:  Negative for pain.  Respiratory:  Negative for shortness of breath.   Cardiovascular:  Negative for chest pain, palpitations and leg swelling.  Gastrointestinal:  Negative for abdominal pain.  Endocrine: Negative for polydipsia.  Skin:  Negative for rash.  Neurological:  Negative for dizziness, weakness and headaches.  Hematological:  Does not bruise/bleed easily.  All other systems reviewed and are negative.      Objective:   Physical Exam Vitals and nursing note reviewed.  Constitutional:      General: She is not in acute distress.  Appearance: Normal appearance. She is well-developed.  HENT:     Head: Normocephalic.     Right Ear: Tympanic membrane normal.     Left Ear: Tympanic membrane normal.     Nose: Nose normal.     Mouth/Throat:     Mouth: Mucous membranes are moist.  Eyes:     Pupils: Pupils are equal, round, and reactive to light.  Neck:     Vascular: No carotid bruit or JVD.  Cardiovascular:     Rate and Rhythm: Normal rate and regular rhythm.     Heart sounds: Normal heart sounds.  Pulmonary:     Effort: Pulmonary effort is normal. No respiratory distress.     Breath sounds: Normal breath sounds. No wheezing or rales.  Chest:     Chest wall: No tenderness.  Abdominal:     General: Bowel sounds are normal. There is no distension or abdominal bruit.      Palpations: Abdomen is soft. There is no hepatomegaly, splenomegaly, mass or pulsatile mass.     Tenderness: There is no abdominal tenderness.  Musculoskeletal:        General: Normal range of motion.     Cervical back: Normal range of motion and neck supple.  Lymphadenopathy:     Cervical: No cervical adenopathy.  Skin:    General: Skin is warm and dry.  Neurological:     Mental Status: She is alert and oriented to person, place, and time.     Deep Tendon Reflexes: Reflexes are normal and symmetric.  Psychiatric:        Behavior: Behavior normal.        Thought Content: Thought content normal.        Judgment: Judgment normal.    BP 127/81   Pulse 100   Temp 97.7 F (36.5 C) (Temporal)   Ht 5\' 5"  (1.651 m)   Wt 197 lb (89.4 kg)   SpO2 96%   BMI 32.78 kg/m          Assessment & Plan:   Erin Hurley comes in today with chief complaint of No chief complaint on file.   Diagnosis and orders addressed:  1. Essential hypertension Low sodium diet - lisinopril (ZESTRIL) 10 MG tablet; Take 1 tablet (10 mg total) by mouth daily.  Dispense: 90 tablet; Refill: 1  2. Gastroesophageal reflux disease without esophagitis Avoid spicy foods Do not eat 2 hours prior to bedtime - pantoprazole (PROTONIX) 20 MG tablet; Take 1 tablet (20 mg total) by mouth daily.  Dispense: 90 tablet; Refill: 1  3. Osteopenia of lumbar spine Weight bearing exercises Dexascan repeated today - Cholecalciferol (VITAMIN D) 50 MCG (2000 UT) tablet; Take 1 tablet (2,000 Units total) by mouth daily.  Dispense: 90 tablet; Refill: 1  4. BMI 35.0-35.9,adult Discussed diet and exercise for person with BMI >25 Will recheck weight in 3-6 months    Labs pending Health Maintenance reviewed Diet and exercise encouraged  Follow up plan: 6 months    Mary-Margaret Daphine Deutscher, FNP

## 2023-08-09 LAB — CBC WITH DIFFERENTIAL/PLATELET
Basophils Absolute: 0 10*3/uL (ref 0.0–0.2)
Basos: 1 %
EOS (ABSOLUTE): 0.1 10*3/uL (ref 0.0–0.4)
Eos: 2 %
Hematocrit: 46.6 % (ref 34.0–46.6)
Hemoglobin: 15.4 g/dL (ref 11.1–15.9)
Immature Grans (Abs): 0 10*3/uL (ref 0.0–0.1)
Immature Granulocytes: 0 %
Lymphocytes Absolute: 0.8 10*3/uL (ref 0.7–3.1)
Lymphs: 15 %
MCH: 30.4 pg (ref 26.6–33.0)
MCHC: 33 g/dL (ref 31.5–35.7)
MCV: 92 fL (ref 79–97)
Monocytes Absolute: 0.5 10*3/uL (ref 0.1–0.9)
Monocytes: 8 %
Neutrophils Absolute: 4.1 10*3/uL (ref 1.4–7.0)
Neutrophils: 74 %
Platelets: 258 10*3/uL (ref 150–450)
RBC: 5.06 x10E6/uL (ref 3.77–5.28)
RDW: 12.3 % (ref 11.7–15.4)
WBC: 5.5 10*3/uL (ref 3.4–10.8)

## 2023-08-09 LAB — LIPID PANEL
Cholesterol, Total: 230 mg/dL — ABNORMAL HIGH (ref 100–199)
HDL: 62 mg/dL (ref >39)
LDL CALC COMMENT:: 3.7 ratio (ref 0.0–4.4)
LDL Chol Calc (NIH): 152 mg/dL — ABNORMAL HIGH (ref 0–99)
Triglycerides: 93 mg/dL (ref 0–149)
VLDL Cholesterol Cal: 16 mg/dL (ref 5–40)

## 2023-08-09 LAB — CMP14+EGFR
ALT: 18 IU/L (ref 0–32)
AST: 17 IU/L (ref 0–40)
Albumin: 4 g/dL (ref 3.9–4.9)
Alkaline Phosphatase: 101 IU/L (ref 44–121)
BUN/Creatinine Ratio: 16 (ref 12–28)
BUN: 13 mg/dL (ref 8–27)
Bilirubin Total: 0.7 mg/dL (ref 0.0–1.2)
CO2: 26 mmol/L (ref 20–29)
Calcium: 9.4 mg/dL (ref 8.7–10.3)
Chloride: 106 mmol/L (ref 96–106)
Creatinine, Ser: 0.83 mg/dL (ref 0.57–1.00)
Globulin, Total: 2.2 g/dL (ref 1.5–4.5)
Glucose: 94 mg/dL (ref 70–99)
Potassium: 4.2 mmol/L (ref 3.5–5.2)
Sodium: 147 mmol/L — ABNORMAL HIGH (ref 134–144)
Total Protein: 6.2 g/dL (ref 6.0–8.5)
eGFR: 78 mL/min/{1.73_m2} (ref >59)

## 2023-10-22 ENCOUNTER — Other Ambulatory Visit: Payer: Self-pay | Admitting: Nurse Practitioner

## 2023-10-22 DIAGNOSIS — I10 Essential (primary) hypertension: Secondary | ICD-10-CM

## 2024-01-11 ENCOUNTER — Ambulatory Visit (INDEPENDENT_AMBULATORY_CARE_PROVIDER_SITE_OTHER): Admitting: Nurse Practitioner

## 2024-01-11 ENCOUNTER — Encounter: Payer: Self-pay | Admitting: Nurse Practitioner

## 2024-01-11 VITALS — BP 124/82 | HR 107 | Temp 98.3°F | Ht 65.0 in | Wt 207.0 lb

## 2024-01-11 DIAGNOSIS — K0500 Acute gingivitis, plaque induced: Secondary | ICD-10-CM | POA: Diagnosis not present

## 2024-01-11 MED ORDER — AMOXICILLIN 500 MG PO TABS: 500.0000 mg | ORAL_TABLET | Freq: Two times a day (BID) | ORAL | 0 refills | Status: DC

## 2024-01-11 NOTE — Progress Notes (Signed)
 Acute Office Visit  Subjective:     Patient ID: Erin Hurley, female    DOB: 08/05/1955, 68 y.o.   MRN: [id number]  Chief Complaint  Patient presents with   lump inside cheek    Right side  X 3 weeks    HPI Erin Hurley is a 68 year old female who presents on 01/11/2024 for an acute visit due to concerns about a lump on the right side of her cheek for the past 3 weeks. She reports that the lump is non-tender. Initially, she thought she was biting the inside of her cheek. She recently had a dental cleaning and had a molar extracted on the same side due to root resorption. She states, It has been about two weeks since the molar was pulled. The lump she is describing is located on the same side as the recent dental extraction.  Active Ambulatory Problems    Diagnosis Date Noted   Essential hypertension 12/29/2015   BMI 35.0-35.9,adult 12/29/2015   Rupture of biceps tendon, traumatic 12/29/2015   Arthritis of right knee 12/26/2014   Cholangitis 09/09/2016   Closed fracture of left distal radius 07/12/2016   Diastolic dysfunction 06/19/2015   Gastroesophageal reflux disease 04/03/2011   History of breast reconstruction 08/06/2011   Malignant neoplasm of female breast (HCC) 04/03/2011   Onychomycosis 04/03/2011   Osteoarthrosis, unspecified whether generalized or localized, pelvic region and thigh 04/03/2011   Osteopenia 04/09/2011   Status post left hip replacement 04/03/2011   Gingivitis, acute 01/11/2024   Resolved Ambulatory Problems    Diagnosis Date Noted   Right knee pain 12/29/2015   Pain and swelling of left wrist 07/12/2016   Shortness of breath 06/18/2015   Stiffness of left wrist joint 07/12/2016   Annual physical exam 04/14/2020   Past Medical History:  Diagnosis Date   Allergy    Arthritis    Cancer (HCC) 2009   GERD (gastroesophageal reflux disease)    Hypertension    Rotator cuff injury     Review of Systems  Constitutional:  Negative for  chills and fever.  HENT:  Negative for congestion and sore throat.   Respiratory:  Negative for cough and shortness of breath.   Cardiovascular:  Negative for chest pain and leg swelling.  Skin:  Negative for itching and rash.  Neurological:  Negative for dizziness and headaches.   Negative unless indicated in HPI    Objective:    BP 124/82   Pulse (!) 107   Temp 98.3 F (36.8 C)   Ht 5' 5 (1.651 m)   Wt 207 lb (93.9 kg)   SpO2 94%   BMI 34.45 kg/m  BP Readings from Last 3 Encounters:  01/11/24 124/82  08/08/23 127/81  07/15/23 135/87   Wt Readings from Last 3 Encounters:  01/11/24 207 lb (93.9 kg)  08/08/23 197 lb (89.4 kg)  07/15/23 196 lb (88.9 kg)      Physical Exam Vitals and nursing note reviewed.  Constitutional:      Appearance: She is obese.  HENT:     Head: Normocephalic and atraumatic.     Nose: Nose normal.     Mouth/Throat:     Mouth: Mucous membranes are moist.     Comments: Small lipoma in upper right cheek Eyes:     Extraocular Movements: Extraocular movements intact.     Conjunctiva/sclera: Conjunctivae normal.     Pupils: Pupils are equal, round, and reactive to light.  Cardiovascular:  Heart sounds: Normal heart sounds.  Pulmonary:     Effort: Pulmonary effort is normal.     Breath sounds: Normal breath sounds.  Musculoskeletal:        General: Normal range of motion.     Right lower leg: No edema.     Left lower leg: No edema.  Skin:    General: Skin is warm and dry.     Findings: No rash.  Neurological:     Mental Status: She is alert and oriented to person, place, and time.  Psychiatric:        Mood and Affect: Mood normal.        Behavior: Behavior normal.        Thought Content: Thought content normal.        Judgment: Judgment normal.    Pertinent labs & imaging results that were available during my care of the patient were reviewed by me and considered in my medical decision making.  No results found for any visits on  01/11/24.      Assessment & Plan:  Gingivitis, acute -     Amoxicillin ; Take 1 tablet (500 mg total) by mouth 2 (two) times daily.  Dispense: 10 tablet; Refill: 0   Erin Hurley is a 68 year old Caucasian female seen today for gingivitis, no acute distress Amoxicillin  500 mg twice daily for 5 days Tylenol/ibuprofen  if develop fever The above assessment and management plan was discussed with the patient. The patient verbalized understanding of and has agreed to the management plan. Patient is aware to call the clinic if they develop any new symptoms or if symptoms persist or worsen. Patient is aware when to return to the clinic for a follow-up visit. Patient educated on when it is appropriate to go to the emergency department.  Return if symptoms worsen or fail to improve.  Erin Hurley [place] Thompson, DNP [facility] [address] [place], [place] [address] [phone]  Note: This document was prepared by [employer] voice dictation technology and any errors that results from this process are unintentional.

## 2024-01-19 ENCOUNTER — Other Ambulatory Visit: Payer: Self-pay | Admitting: Nurse Practitioner

## 2024-01-19 DIAGNOSIS — I10 Essential (primary) hypertension: Secondary | ICD-10-CM

## 2024-02-16 ENCOUNTER — Other Ambulatory Visit: Payer: Self-pay | Admitting: Nurse Practitioner

## 2024-02-16 DIAGNOSIS — I10 Essential (primary) hypertension: Secondary | ICD-10-CM

## 2024-02-16 MED ORDER — LISINOPRIL 10 MG PO TABS: 10.0000 mg | ORAL_TABLET | Freq: Every day | ORAL | 1 refills | Status: DC

## 2024-02-16 NOTE — Telephone Encounter (Signed)
 MMM pt NTBS 30-d given 01/19/24

## 2024-02-16 NOTE — Telephone Encounter (Signed)
 Left message to make appt to get medications refilled

## 2024-02-16 NOTE — Addendum Note (Signed)
 Addended by: Chynah Orihuela D on: 02/16/2024 09:56 AM   Modules accepted: Orders

## 2024-02-16 NOTE — Telephone Encounter (Signed)
 Appt 04-02-2024 with MMM.

## 2024-03-29 ENCOUNTER — Ambulatory Visit

## 2024-04-02 ENCOUNTER — Ambulatory Visit: Payer: Self-pay | Admitting: *Deleted

## 2024-04-02 NOTE — Telephone Encounter (Signed)
 Apt scheduled.

## 2024-04-02 NOTE — Telephone Encounter (Signed)
 Caller disconnected before transfer to triage.  No contact will try to call back.      Copied from CRM #[id number]. Topic: Clinical - Red Word Triage >> Apr 02, 2024  8:55 AM Zane F wrote: [facility] that prompted transfer to Nurse Triage:   Concern: worsening joint pain (all over but mainly the right knee)  Symptoms:   in the right knee shooting pain  When did the symptoms start?: last couple of month   What have you done to aid in the concern ? Have you taken anything to assist with the matter?: Yes   If so, what did you take?: ibuprofen   Wanted to let you know I will be transferring you to further discuss your concern. Please be advised the nurse can assist with scheduling.

## 2024-04-02 NOTE — Telephone Encounter (Signed)
 FYI Only or Action Required?: FYI only for provider.  Patient was last seen in primary care on 01/11/2024 by Erin Morton Sebastian Nena, NP.  Called Nurse Triage reporting Joint Pain and Knee Pain.  Symptoms began chronic, over a year.  Interventions attempted: OTC medications: ibuprofen .  Symptoms are: right knee pain gradually worsening over the past couple of months with walking, bowling or climbing stairs.  Triage Disposition: See PCP Within 2 Weeks  Patient/caregiver understands and will follow disposition?: Yes             Reason for Disposition  [1] MILD pain (e.g., does not interfere with normal activities) AND [2] present > 7 days  Answer Assessment - Initial Assessment Questions 1. LOCATION and RADIATION: Where is the pain located?      Right knee.  2. QUALITY: What does the pain feel like?  (e.g., sharp, dull, aching, burning)     Varies, sharp to dull aching.  3. SEVERITY: How bad is the pain? What does it keep you from doing?   (Scale 1-10; or mild, moderate, severe)     Sometimes gets as bad as 7/10, currently at 2-3/10.   4. ONSET: When did the pain start? Does it come and go, or is it there all the time?     Ongoing/chronic over a year. Worsening a couple months.   5. RECURRENT: Have you had this pain before? If Yes, ask: When, and what happened then?     Chronic. Seen before and told to take ibuprofen .  6. SETTING: Has there been any recent work, exercise or other activity that involved that part of the body?      Bowling.  7. AGGRAVATING FACTORS: What makes the knee pain worse? (e.g., walking, climbing stairs, running)     Climbing stairs, walking aggravate. She states it felt like it was going to give out.  8. ASSOCIATED SYMPTOMS: Is there any swelling or redness of the knee?     No redness or swelling.  9. OTHER SYMPTOMS: Do you have any other symptoms? (e.g., calf pain, chest pain, difficulty breathing, fever)      Denies chest pain, difficulty breathing, fever.  Protocols used: Knee Pain-A-AH

## 2024-04-03 ENCOUNTER — Ambulatory Visit: Admitting: Nurse Practitioner

## 2024-04-06 ENCOUNTER — Ambulatory Visit: Admitting: Nurse Practitioner

## 2024-04-09 ENCOUNTER — Ambulatory Visit (INDEPENDENT_AMBULATORY_CARE_PROVIDER_SITE_OTHER)

## 2024-04-09 ENCOUNTER — Ambulatory Visit (INDEPENDENT_AMBULATORY_CARE_PROVIDER_SITE_OTHER): Admitting: Nurse Practitioner

## 2024-04-09 ENCOUNTER — Encounter: Payer: Self-pay | Admitting: Nurse Practitioner

## 2024-04-09 VITALS — BP 145/81 | HR 87 | Temp 97.3°F | Ht 65.0 in | Wt 205.0 lb

## 2024-04-09 DIAGNOSIS — M25561 Pain in right knee: Secondary | ICD-10-CM

## 2024-04-09 MED ORDER — PREDNISONE 10 MG (21) PO TBPK: ORAL_TABLET | ORAL | 0 refills | Status: DC

## 2024-04-09 NOTE — Progress Notes (Signed)
 Subjective:    Patient ID: Erin Hurley, female    DOB: 1956-03-02, 68 y.o.   MRN: [id number]   Chief Complaint: Knee Pain (Right knee/)   Knee Pain  There was no injury mechanism. The pain is present in the right knee. The quality of the pain is described as burning. The pain is at a severity of 7/10. The pain is moderate. The pain has been Intermittent since onset. Associated symptoms include an inability to bear weight. The symptoms are aggravated by movement and weight bearing. She has tried ice for the symptoms. The treatment provided mild relief.    Patient Active Problem List   Diagnosis Date Noted   Gingivitis, acute 01/11/2024   Cholangitis (HCC) 09/09/2016   Closed fracture of left distal radius 07/12/2016   Essential hypertension 12/29/2015   BMI 35.0-35.9,adult 12/29/2015   Rupture of biceps tendon, traumatic 12/29/2015   Diastolic dysfunction 06/19/2015   Arthritis of right knee 12/26/2014   History of breast reconstruction 08/06/2011   Osteopenia 04/09/2011   Gastroesophageal reflux disease 04/03/2011   Malignant neoplasm of female breast (HCC) 04/03/2011   Onychomycosis 04/03/2011   Osteoarthrosis, unspecified whether generalized or localized, pelvic region and thigh 04/03/2011   Status post left hip replacement 04/03/2011       Review of Systems  Constitutional:  Negative for diaphoresis.  Eyes:  Negative for pain.  Respiratory:  Negative for shortness of breath.   Cardiovascular:  Negative for chest pain, palpitations and leg swelling.  Gastrointestinal:  Negative for abdominal pain.  Endocrine: Negative for polydipsia.  Skin:  Negative for rash.  Neurological:  Negative for dizziness, weakness and headaches.  Hematological:  Does not bruise/bleed easily.  All other systems reviewed and are negative.      Objective:   Physical Exam Constitutional:      Appearance: Normal appearance.  Cardiovascular:     Rate and Rhythm: Normal rate and  regular rhythm.     Pulses: Normal pulses.     Heart sounds: Normal heart sounds.  Pulmonary:     Effort: Pulmonary effort is normal.     Breath sounds: Normal breath sounds.  Musculoskeletal:     Comments: Mild right knee effusion Crepitus on flexion and extension All tendon intact  Neurological:     General: No focal deficit present.     Mental Status: She is alert and oriented to person, place, and time.  Psychiatric:        Mood and Affect: Mood normal.        Behavior: Behavior normal.    BP (!) 145/81   Pulse 87   Temp (!) 97.3 F (36.3 C) (Temporal)   Ht 5' 5 (1.651 m)   Wt 205 lb (93 kg)   SpO2 97%   BMI 34.11 kg/m    Mild osteoarthritis of right knee joint-Preliminary reading by Ronal Lunger, FNP  [facility]      Assessment & Plan:   Erin Hurley in today with chief complaint of Knee Pain (Right knee/)   1. Acute pain of right knee (Primary) Ice BID Elevate when sitting RTO prn - DG Knee 1-2 Views Right - predniSONE (STERAPRED UNI-PAK 21 TAB) 10 MG (21) TBPK tablet; As directed x 6 days  Dispense: 21 tablet; Refill: 0    The above assessment and management plan was discussed with the patient. The patient verbalized understanding of and has agreed to the management plan. Patient is aware to call the clinic if symptoms  persist or worsen. Patient is aware when to return to the clinic for a follow-up visit. Patient educated on when it is appropriate to go to the emergency department.   Mary-Margaret Gladis, FNP

## 2024-04-09 NOTE — Patient Instructions (Signed)
 Acute Knee Pain, Adult Many things can cause knee pain. Sometimes, knee pain is sudden (acute). It may be caused by damage, swelling, or irritation of the muscles and tissues that support your knee. Pain may come from: A fall. An injury to the knee from twisting motions. A hit to the knee. Infection. The pain often goes away on its own with time and rest. If the pain does not go away, tests may be done to find out what is causing the pain. These may include: Imaging tests, such as an X-ray, MRI, CT scan, or ultrasound. Joint aspiration. In this test, fluid is removed from the knee and checked. Arthroscopy. In this test, a lighted tube is put in the knee and an image is shown on a screen. A biopsy. In this test, a health care provider will remove a small piece of tissue for testing. Follow these instructions at home: If you have a knee sleeve or brace that can be taken off:  Wear the knee sleeve or brace as told by your provider. Take it off only if your provider says that you can. Check the skin around it every day. Tell your provider if you see problems. Loosen the knee sleeve or brace if your toes tingle, are numb, or turn cold and blue. Keep the knee sleeve or brace clean and dry. Bathing If the knee sleeve or brace is not waterproof: Do not let it get wet. Cover it when you take a bath or shower. Use a cover that does not let any water in. Managing pain, stiffness, and swelling  If told, put ice on the area. If you have a knee sleeve or brace that you can take off, remove it as told. Put ice in a plastic bag. Place a towel between your skin and the bag. Leave the ice on for 20 minutes, 2-3 times a day. If your skin turns bright red, take off the ice right away to prevent skin damage. The risk of damage is higher if you cannot feel pain, heat, or cold. Move your toes often to reduce stiffness and swelling. Raise the injured area above the level of your heart while you are sitting  or lying down. Use a pillow to support your foot as needed. If told, use an elastic bandage to put pressure (compression) on your injured knee. This may control swelling, give support, and help with discomfort. Sleep with a pillow under your knee. Activity Rest your knee. Do not do things that cause pain or make pain worse. Do not stand or walk on your injured knee until you're told it's okay. Use crutches as told. Avoid activities where both feet leave the ground at the same time and put stress on the joints. Avoid running, jumping rope, and doing jumping jacks. Work with a physical therapist to make a safe exercise program if told. Physical therapy helps your knee move better and get stronger. Exercise as told. General instructions Take your medicines only as told by your provider. If you are overweight, work with your provider and an expert in healthy eating, called a dietician, to set goals to lose weight. Being overweight can make your knee hurt more. Do not smoke, vape, or use products with nicotine or tobacco in them. If you need help quitting, talk with your provider. Return to normal activities when you are told. Ask what things are safe for you to do. Watch for any changes in your symptoms. Keep all follow-up visits. Your provider will check  your healing and adjust treatments if needed. Contact a health care provider if: The knee pain does not stop. The knee pain changes or gets worse. You have a fever along with knee pain. Your knee is red or feels warm when you touch it. Your knee gives out or locks up. Get help right away if: Your knee swells and the swelling gets worse. You cannot move your knee. You have very bad knee pain that does not get better with medicine. This information is not intended to replace advice given to you by your health care provider. Make sure you discuss any questions you have with your health care provider. Document Revised: 03/03/2023 Document  Reviewed: 07/26/2022 Elsevier Patient Education  2024 [employer].

## 2024-04-12 ENCOUNTER — Ambulatory Visit: Payer: Self-pay | Admitting: Nurse Practitioner

## 2024-04-13 ENCOUNTER — Ambulatory Visit (INDEPENDENT_AMBULATORY_CARE_PROVIDER_SITE_OTHER)

## 2024-04-13 VITALS — BP 139/84 | HR 87 | Temp 97.5°F | Ht 65.0 in | Wt 205.0 lb

## 2024-04-13 DIAGNOSIS — Z23 Encounter for immunization: Secondary | ICD-10-CM

## 2024-04-13 DIAGNOSIS — Z1211 Encounter for screening for malignant neoplasm of colon: Secondary | ICD-10-CM | POA: Diagnosis not present

## 2024-04-13 DIAGNOSIS — Z0000 Encounter for general adult medical examination without abnormal findings: Secondary | ICD-10-CM | POA: Diagnosis not present

## 2024-04-13 NOTE — Patient Instructions (Signed)
 Ms. Erin Hurley,  Thank you for taking the time for your Medicare Wellness Visit. I appreciate your continued commitment to your health goals. Please review the care plan we discussed, and feel free to reach out if I can assist you further.  Medicare recommends these wellness visits once per year to help you and your care team stay ahead of potential health issues. These visits are designed to focus on prevention, allowing your provider to concentrate on managing your acute and chronic conditions during your regular appointments.  Please note that Annual Wellness Visits do not include a physical exam. Some assessments may be limited, especially if the visit was conducted virtually. If needed, we may recommend a separate in-person follow-up with your provider.  Ongoing Care Seeing your primary care provider every 3 to 6 months helps us  monitor your health and provide consistent, personalized care.   Referrals If a referral was made during today's visit and you haven't received any updates within two weeks, please contact the referred provider directly to check on the status.  Recommended Screenings:  Health Maintenance  Topic Date Due   Pneumococcal Vaccine for age over 67 (1 of 1 - PCV) Never done   COVID-19 Vaccine (3 - Moderna risk series) 10/16/2019   Colon Cancer Screening  06/14/2021   Breast Cancer Screening  08/18/2022   Medicare Annual Wellness Visit  10/25/2023   Flu Shot  09/11/2024*   DEXA scan (bone density measurement)  01/30/2025   DTaP/Tdap/Td vaccine (3 - Td or Tdap) 06/14/2029   Hepatitis C Screening  Completed   Zoster (Shingles) Vaccine  Completed   Meningitis B Vaccine  Aged Out  *Topic was postponed. The date shown is not the original due date.       04/13/2024   10:50 AM  Advanced Directives  Does Patient Have a Medical Advance Directive? No   Advance Care Planning is important because it: Ensures you receive medical care that aligns with your values, goals, and  preferences. Provides guidance to your family and loved ones, reducing the emotional burden of decision-making during critical moments.  Vision: Annual vision screenings are recommended for early detection of glaucoma, cataracts, and diabetic retinopathy. These exams can also reveal signs of chronic conditions such as diabetes and high blood pressure.  Dental: Annual dental screenings help detect early signs of oral cancer, gum disease, and other conditions linked to overall health, including heart disease and diabetes.  Please see the attached documents for additional preventive care recommendations.

## 2024-04-13 NOTE — Progress Notes (Signed)
 Subjective:   Erin Hurley is a 68 y.o. who presents for a Medicare Wellness preventive visit.  As a reminder, Annual Wellness Visits don't include a physical exam, and some assessments may be limited, especially if this visit is performed virtually. We may recommend an in-person follow-up visit with your provider if needed.  Visit Complete: In person    Persons Participating in Visit: Patient.  AWV Questionnaire: No: Patient Medicare AWV questionnaire was not completed prior to this visit.  Cardiac Risk Factors include: advanced age (>8men, >34 women);hypertension;obesity (BMI >30kg/m2)     Objective:    Today's Vitals   04/13/24 1044  BP: 139/84  Pulse: 87  Temp: (!) 97.5 F (36.4 C)  TempSrc: Temporal  Weight: 205 lb 0.6 oz (93 kg)  Height: 5' 5 (1.651 m)   Body mass index is 34.12 kg/m.     04/13/2024   10:50 AM 10/25/2022    4:16 PM 07/01/2016    8:22 AM  Advanced Directives  Does Patient Have a Medical Advance Directive? No No Yes   Type of Advance Directive   Living will  Does patient want to make changes to medical advance directive?  No - Patient declined No - Patient declined   Would patient like information on creating a medical advance directive?  No - Patient declined      Data saved with a previous flowsheet row definition    Current Medications (verified) Outpatient Encounter Medications as of 04/13/2024  Medication Sig   Cholecalciferol (VITAMIN D ) 50 MCG (2000 UT) tablet Take 1 tablet (2,000 Units total) by mouth daily.   ibuprofen  (ADVIL ) 800 MG tablet Take 1 tablet (800 mg total) by mouth every 8 (eight) hours as needed.   lisinopril  (ZESTRIL ) 10 MG tablet Take 1 tablet (10 mg total) by mouth daily.   predniSONE (STERAPRED UNI-PAK 21 TAB) 10 MG (21) TBPK tablet As directed x 6 days   No facility-administered encounter medications on file as of 04/13/2024.    Allergies (verified) Glucosamine, Morphine, Shellfish protein-containing  drug products, Iodine, Iodine-131, Latex, and Tape   History: Past Medical History:  Diagnosis Date   Allergy    Arthritis    Cancer (HCC) 2009   breast   GERD (gastroesophageal reflux disease)    Hypertension 12/18/2005   Rotator cuff injury    left - tear    Past Surgical History:  Procedure Laterality Date   ABDOMINAL HYSTERECTOMY     BREAST LUMPECTOMY Right    BREAST REDUCTION SURGERY     BREAST SURGERY     CHOLECYSTECTOMY     FRACTURE SURGERY     JOINT REPLACEMENT Left    hip   TUBAL LIGATION     Family History  Problem Relation Age of Onset   COPD Mother    Cancer Mother        lung   Heart disease Mother    Immunodeficiency Mother    Heart disease Father    Cancer Father        liver cancer   Social History   Socioeconomic History   Marital status: Married    Spouse name: Helayne   Number of children: 2   Years of education: Not on file   Highest education level: Associate degree: academic program  Occupational History   Not on file  Tobacco Use   Smoking status: Never    Passive exposure: Never   Smokeless tobacco: Never  Substance and Sexual Activity  Alcohol use: No   Drug use: No   Sexual activity: Not on file  Other Topics Concern   Not on file  Social History Narrative   Not on file   Social Drivers of Health   Financial Resource Strain: Low Risk  (04/13/2024)   Overall Financial Resource Strain (CARDIA)    Difficulty of Paying Living Expenses: Not very hard  Food Insecurity: No Food Insecurity (04/13/2024)   Hunger Vital Sign    Worried About Running Out of Food in the Last Year: Never true    Ran Out of Food in the Last Year: Never true  Transportation Needs: No Transportation Needs (04/13/2024)   PRAPARE - Administrator, Civil Service (Medical): No    Lack of Transportation (Non-Medical): No  Physical Activity: Sufficiently Active (04/13/2024)   Exercise Vital Sign    Days of Exercise per Week: 5 days    Minutes of  Exercise per Session: 50 min  Stress: Stress Concern Present (04/13/2024)   [employer] of Occupational Health - Occupational Stress Questionnaire    Feeling of Stress: To some extent  Social Connections: Socially Isolated (04/13/2024)   Social Connection and Isolation Panel    Frequency of Communication with Friends and Family: Never    Frequency of Social Gatherings with Friends and Family: Never    Attends Religious Services: Never    Diplomatic Services Operational Officer: No    Attends Engineer, Structural: Not on file    Marital Status: Married    Tobacco Counseling Counseling given: Yes    Clinical Intake:  Pre-visit preparation completed: Yes  Pain : No/denies pain     BMI - recorded: 34.12 Nutritional Status: BMI > 30  Obese Nutritional Risks: None Diabetes: No  No results found for: HGBA1C   How often do you need to have someone help you when you read instructions, pamphlets, or other written materials from your doctor or pharmacy?: 1 - Never  Interpreter Needed?: No  Information entered by :: alia t/cma   Activities of Daily Living     04/13/2024   10:49 AM  In your present state of health, do you have any difficulty performing the following activities:  Hearing? 0  Vision? 0  Difficulty concentrating or making decisions? 0  Walking or climbing stairs? 1  Dressing or bathing? 0  Doing errands, shopping? 0  Preparing Food and eating ? N  Using the Toilet? N  In the past six months, have you accidently leaked urine? Y  Do you have problems with loss of bowel control? Y  Managing your Medications? N  Managing your Finances? N  Housekeeping or managing your Housekeeping? N    Patient Care Team: Gladis Mustard, FNP as PCP - General (Family Medicine) Alvan Dorn FALCON, MD as PCP - Cardiology (Cardiology)  I have updated your Care Teams any recent Medical Services you may have received from other providers in the past  year.     Assessment:   This is a routine wellness examination for [facility].  Hearing/Vision screen Hearing Screening - Comments:: Pt have hearing dif Vision Screening - Comments:: Pt wear glasses/pt goes to [facility] in [place]/last  ov  10/2023   Goals Addressed             This Visit's Progress    Patient Stated   On track    10/25/2022 AWV Goal: Fall Prevention  Over the next year, patient will decrease their risk  for falls by: Using assistive devices, such as a cane or walker, as needed Identifying fall risks within their home and correcting them by: Removing throw rugs Adding handrails to stairs or ramps Removing clutter and keeping a clear pathway throughout the home Increasing light, especially at night Adding shower handles/bars Raising toilet seat Identifying potential personal risk factors for falls: Medication side effects Incontinence/urgency Vestibular dysfunction Hearing loss Musculoskeletal disorders Neurological disorders Orthostatic hypotension         Depression Screen     04/13/2024   10:51 AM 04/09/2024    3:05 PM 01/11/2024    8:43 AM 08/08/2023    8:30 AM 07/15/2023    8:08 AM 04/11/2023    1:18 PM 01/31/2023    9:35 AM  PHQ 2/9 Scores  PHQ - 2 Score 4 0 0 0 0 0 0  PHQ- 9 Score 11  3   0 2    Fall Risk     04/13/2024   10:47 AM 04/09/2024    3:05 PM 01/11/2024    8:43 AM 08/08/2023    8:30 AM 07/15/2023    8:08 AM  Fall Risk   Falls in the past year? 1 1 1 1 1   Number falls in past yr: 0 0 0 0 0  Injury with Fall? 1 0 0 0 1  Risk for fall due to : Impaired balance/gait;Impaired mobility History of fall(s) No Fall Risks History of fall(s) History of fall(s)  Follow up Falls evaluation completed;Education provided Education provided Falls evaluation completed Education provided Education provided    MEDICARE RISK AT HOME:  Medicare Risk at Home Any stairs in or around the home?: Yes If so, are there any without  handrails?: Yes Home free of loose throw rugs in walkways, pet beds, electrical cords, etc?: Yes Adequate lighting in your home to reduce risk of falls?: Yes Life alert?: No Use of a cane, walker or w/c?: No Grab bars in the bathroom?: Yes Shower chair or bench in shower?: No Elevated toilet seat or a handicapped toilet?: Yes  TIMED UP AND GO:  Was the test performed?  no  Cognitive Function: 6CIT completed    10/25/2022    4:20 PM  MMSE - Mini Mental State Exam  Orientation to time 5  Orientation to Place 5  Registration 3  Attention/ Calculation 5  Recall 3  Language- name 2 objects 2  Language- repeat 1  Language- follow 3 step command 3  Language- read & follow direction 1  Write a sentence 1  Copy design 1  Total score 30        04/13/2024   10:50 AM  6CIT Screen  What Year? 0 points  What month? 0 points  What time? 0 points  Count back from 20 0 points  Months in reverse 0 points  Repeat phrase 0 points  Total Score 0 points    Immunizations Immunization History  Administered Date(s) Administered   Moderna Sars-Covid-2 Vaccination 08/20/2019, 09/18/2019   PNEUMOCOCCAL CONJUGATE-20 04/13/2024   Tdap 09/03/2009, 06/15/2019   Zoster Recombinant(Shingrix ) 07/26/2022, 10/25/2022    Screening Tests Health Maintenance  Topic Date Due   COVID-19 Vaccine (3 - Moderna risk series) 10/16/2019   Colonoscopy  06/14/2021   Mammogram  08/18/2022   Influenza Vaccine  09/11/2024 (Originally 01/13/2024)   DEXA SCAN  01/30/2025   Medicare Annual Wellness (AWV)  04/13/2025   DTaP/Tdap/Td (3 - Td or Tdap) 06/14/2029   Pneumococcal Vaccine: 50+ Years  Completed  Hepatitis C Screening  Completed   Zoster Vaccines- Shingrix   Completed   Meningococcal B Vaccine  Aged Out    Health Maintenance Items Addressed: See Nurse Notes at the end of this note  Additional Screening:  Vision Screening: Recommended annual ophthalmology exams for early detection of glaucoma  and other disorders of the eye. Is the patient up to date with their annual eye exam?  Yes  Who is the provider or what is the name of the office in which the patient attends annual eye exams? [facility] in [place], [place]  Dental Screening: Recommended annual dental exams for proper oral hygiene  Community Resource Referral / Chronic Care Management: CRR required this visit?  No   CCM required this visit?  No   Plan:    I have personally reviewed and noted the following in the patient's chart:   Medical and social history Use of alcohol, tobacco or illicit drugs  Current medications and supplements including opioid prescriptions. Patient is not currently taking opioid prescriptions. Functional ability and status Nutritional status Physical activity Advanced directives List of other physicians Hospitalizations, surgeries, and ER visits in previous 12 months Vitals Screenings to include cognitive, depression, and falls Referrals and appointments  In addition, I have reviewed and discussed with patient certain preventive protocols, quality metrics, and best practice recommendations. A written personalized care plan for preventive services as well as general preventive health recommendations were provided to patient.   Ozie Ned, CMA   04/13/2024   After Visit Summary: (In Person-Declined) Patient declined AVS at this time.  Notes: PCP Follow Up Recommendations: pt is due the following: covid vaccine, colonoscopy--ref GI ordered, Per pt will make an apt w/mammogram

## 2024-04-14 ENCOUNTER — Other Ambulatory Visit: Payer: Self-pay | Admitting: Nurse Practitioner

## 2024-04-14 DIAGNOSIS — I10 Essential (primary) hypertension: Secondary | ICD-10-CM

## 2024-05-20 ENCOUNTER — Other Ambulatory Visit: Payer: Self-pay | Admitting: Nurse Practitioner

## 2024-05-20 DIAGNOSIS — I10 Essential (primary) hypertension: Secondary | ICD-10-CM

## 2024-05-21 NOTE — Telephone Encounter (Signed)
 MMM pt NTBS 30-d given 04/16/24

## 2024-06-01 NOTE — Telephone Encounter (Signed)
 Spoke with patient and made her an appt to be seen for chronic follow up on Monday, Dec 22nd.

## 2024-06-04 ENCOUNTER — Ambulatory Visit: Payer: Self-pay | Admitting: Nurse Practitioner

## 2024-06-04 ENCOUNTER — Encounter: Payer: Self-pay | Admitting: Nurse Practitioner

## 2024-06-04 VITALS — BP 137/85 | HR 87 | Temp 97.9°F | Ht 65.0 in | Wt 209.0 lb

## 2024-06-04 DIAGNOSIS — K219 Gastro-esophageal reflux disease without esophagitis: Secondary | ICD-10-CM

## 2024-06-04 DIAGNOSIS — I10 Essential (primary) hypertension: Secondary | ICD-10-CM | POA: Diagnosis not present

## 2024-06-04 DIAGNOSIS — Z6835 Body mass index (BMI) 35.0-35.9, adult: Secondary | ICD-10-CM | POA: Diagnosis not present

## 2024-06-04 DIAGNOSIS — M8588 Other specified disorders of bone density and structure, other site: Secondary | ICD-10-CM | POA: Diagnosis not present

## 2024-06-04 LAB — LIPID PANEL

## 2024-06-04 MED ORDER — LISINOPRIL 10 MG PO TABS: 10.0000 mg | ORAL_TABLET | Freq: Every day | ORAL | 0 refills | Status: DC

## 2024-06-04 NOTE — Progress Notes (Signed)
 "  Subjective:    Patient ID: Erin Hurley, female    DOB: 07-22-1955, 68 y.o.   MRN: [id number]   Chief Complaint: medical management of chronic issues     HPI:  Erin Hurley is a 68 y.o. who identifies as a female who was assigned female at birth.   Social history: Lives with: husband Work history: works at [employer] in today for follow up of the following chronic medical issues:  1. Essential hypertension No c/o chest pain, sob or headache. Does not check blood pressure at home. BP Readings from Last 3 Encounters:  04/13/24 139/84  04/09/24 (!) 145/81  01/11/24 124/82     2. Gastroesophageal reflux disease without esophagitis Takes protonix  and is doing well.  3. Osteopenia of lumbar spine Last dexscan was done on 01/21/23. T score was -1.7%  4. BMI 35.0-35.9,adult Weight is up 4lbs  Wt Readings from Last 3 Encounters:  06/04/24 209 lb (94.8 kg)  04/13/24 205 lb 0.6 oz (93 kg)  04/09/24 205 lb (93 kg)   BMI Readings from Last 3 Encounters:  06/04/24 34.78 kg/m  04/13/24 34.12 kg/m  04/09/24 34.11 kg/m       New complaints: None today  Allergies  Allergen Reactions   Glucosamine Itching   Morphine Nausea And Vomiting   Shellfish Protein-Containing Drug Products Itching   Iodine Rash   Iodine-131 Rash   Latex Rash   Tape Rash    [Derm] use paper tape please [Derm] use paper tape please   Outpatient Encounter Medications as of 06/04/2024  Medication Sig   Cholecalciferol (VITAMIN D ) 50 MCG (2000 UT) tablet Take 1 tablet (2,000 Units total) by mouth daily.   ibuprofen  (ADVIL ) 800 MG tablet Take 1 tablet (800 mg total) by mouth every 8 (eight) hours as needed.   lisinopril  (ZESTRIL ) 10 MG tablet Take 1 tablet (10 mg total) by mouth daily. **NEEDS TO BE SEEN BEFORE NEXT REFILL**   predniSONE  (STERAPRED UNI-PAK 21 TAB) 10 MG (21) TBPK tablet As directed x 6 days   No facility-administered encounter medications on file as  of 06/04/2024.    Past Surgical History:  Procedure Laterality Date   ABDOMINAL HYSTERECTOMY     BREAST LUMPECTOMY Right    BREAST REDUCTION SURGERY     BREAST SURGERY     CHOLECYSTECTOMY     FRACTURE SURGERY     JOINT REPLACEMENT Left    hip   TUBAL LIGATION      Family History  Problem Relation Age of Onset   COPD Mother    Cancer Mother        lung   Heart disease Mother    Immunodeficiency Mother    Heart disease Father    Cancer Father        liver cancer      Controlled substance contract: n/a     Review of Systems  Constitutional:  Negative for diaphoresis.  Eyes:  Negative for pain.  Respiratory:  Negative for shortness of breath.   Cardiovascular:  Negative for chest pain, palpitations and leg swelling.  Gastrointestinal:  Negative for abdominal pain.  Endocrine: Negative for polydipsia.  Skin:  Negative for rash.  Neurological:  Negative for dizziness, weakness and headaches.  Hematological:  Does not bruise/bleed easily.  All other systems reviewed and are negative.      Objective:   Physical Exam Vitals and nursing note reviewed.  Constitutional:  General: She is not in acute distress.    Appearance: Normal appearance. She is well-developed.  HENT:     Head: Normocephalic.     Right Ear: Tympanic membrane normal.     Left Ear: Tympanic membrane normal.     Nose: Nose normal.     Mouth/Throat:     Mouth: Mucous membranes are moist.  Eyes:     Pupils: Pupils are equal, round, and reactive to light.  Neck:     Vascular: No carotid bruit or JVD.  Cardiovascular:     Rate and Rhythm: Normal rate and regular rhythm.     Heart sounds: Normal heart sounds.  Pulmonary:     Effort: Pulmonary effort is normal. No respiratory distress.     Breath sounds: Normal breath sounds. No wheezing or rales.  Chest:     Chest wall: No tenderness.  Abdominal:     General: Bowel sounds are normal. There is no distension or abdominal bruit.      Palpations: Abdomen is soft. There is no hepatomegaly, splenomegaly, mass or pulsatile mass.     Tenderness: There is no abdominal tenderness.  Musculoskeletal:        General: Normal range of motion.     Cervical back: Normal range of motion and neck supple.  Lymphadenopathy:     Cervical: No cervical adenopathy.  Skin:    General: Skin is warm and dry.  Neurological:     Mental Status: She is alert and oriented to person, place, and time.     Deep Tendon Reflexes: Reflexes are normal and symmetric.  Psychiatric:        Behavior: Behavior normal.        Thought Content: Thought content normal.        Judgment: Judgment normal.    BP 137/85   Pulse 87   Temp 97.9 F (36.6 C) (Temporal)   Ht 5' 5 (1.651 m)   Wt 209 lb (94.8 kg)   SpO2 96%   BMI 34.78 kg/m         Assessment & Plan:   [name] comes in today with chief complaint of medical management of chronic issues    Diagnosis and orders addressed:  1. Essential hypertension Low sodium diet - lisinopril  (ZESTRIL ) 10 MG tablet; Take 1 tablet (10 mg total) by mouth daily.  Dispense: 90 tablet; Refill: 1  2. Gastroesophageal reflux disease without esophagitis Avoid spicy foods Do not eat 2 hours prior to bedtime - pantoprazole  (PROTONIX ) 20 MG tablet; Take 1 tablet (20 mg total) by mouth daily.  Dispense: 90 tablet; Refill: 1  3. Osteopenia of lumbar spine Weight bearing exercises Dexascan repeated today - Cholecalciferol (VITAMIN D ) 50 MCG (2000 UT) tablet; Take 1 tablet (2,000 Units total) by mouth daily.  Dispense: 90 tablet; Refill: 1  4. BMI 35.0-35.9,adult Discussed diet and exercise for person with BMI >25 Will recheck weight in 3-6 months    Labs pending Health Maintenance reviewed Diet and exercise encouraged  Follow up plan: 6 months    Mary-Margaret Gladis, FNP  "

## 2024-06-04 NOTE — Patient Instructions (Signed)
 Exercising to Stay Healthy To become healthy and stay healthy, it is recommended that you do moderate-intensity and vigorous-intensity exercise. You can tell that you are exercising at a moderate intensity if your heart starts beating faster and you start breathing faster but can still hold a conversation. You can tell that you are exercising at a vigorous intensity if you are breathing much harder and faster and cannot hold a conversation while exercising. How can exercise benefit me? Exercising regularly is important. It has many health benefits, such as: Improving overall fitness, flexibility, and endurance. Increasing bone density. Helping with weight control. Decreasing body fat. Increasing muscle strength and endurance. Reducing stress and tension, anxiety, depression, or anger. Improving overall health. What guidelines should I follow while exercising? Before you start a new exercise program, talk with your health care provider. Do not exercise so much that you hurt yourself, feel dizzy, or get very short of breath. Wear comfortable clothes and wear shoes with good support. Drink plenty of water while you exercise to prevent dehydration or heat stroke. Work out until your breathing and your heartbeat get faster (moderate intensity). How often should I exercise? Choose an activity that you enjoy, and set realistic goals. Your health care provider can help you make an activity plan that is individually designed and works best for you. Exercise regularly as told by your health care provider. This may include: Doing strength training two times a week, such as: Lifting weights. Using resistance bands. Push-ups. Sit-ups. Yoga. Doing a certain intensity of exercise for a given amount of time. Choose from these options: A total of 150 minutes of moderate-intensity exercise every week. A total of 75 minutes of vigorous-intensity exercise every week. A mix of moderate-intensity and  vigorous-intensity exercise every week. Children, pregnant women, people who have not exercised regularly, people who are overweight, and older adults may need to talk with a health care provider about what activities are safe to perform. If you have a medical condition, be sure to talk with your health care provider before you start a new exercise program. What are some exercise ideas? Moderate-intensity exercise ideas include: Walking 1 mile (1.6 km) in about 15 minutes. Biking. Hiking. Golfing. Dancing. Water aerobics. Vigorous-intensity exercise ideas include: Walking 4.5 miles (7.2 km) or more in about 1 hour. Jogging or running 5 miles (8 km) in about 1 hour. Biking 10 miles (16.1 km) or more in about 1 hour. Lap swimming. Roller-skating or in-line skating. Cross-country skiing. Vigorous competitive sports, such as football, basketball, and soccer. Jumping rope. Aerobic dancing. What are some everyday activities that can help me get exercise? Yard work, such as: Child psychotherapist. Raking and bagging leaves. Washing your car. Pushing a stroller. Shoveling snow. Gardening. Washing windows or floors. How can I be more active in my day-to-day activities? Use stairs instead of an elevator. Take a walk during your lunch break. If you drive, park your car farther away from your work or school. If you take public transportation, get off one stop early and walk the rest of the way. Stand up or walk around during all of your indoor phone calls. Get up, stretch, and walk around every 30 minutes throughout the day. Enjoy exercise with a friend. Support to continue exercising will help you keep a regular routine of activity. Where to find more information You can find more information about exercising to stay healthy from: U.[place]. Department of Health and Human Services: ThisPath.fi Centers for Disease Control and Prevention (  CDC): FootballExhibition.com.br Summary Exercising regularly is  important. It will improve your overall fitness, flexibility, and endurance. Regular exercise will also improve your overall health. It can help you control your weight, reduce stress, and improve your bone density. Do not exercise so much that you hurt yourself, feel dizzy, or get very short of breath. Before you start a new exercise program, talk with your health care provider. This information is not intended to replace advice given to you by your health care provider. Make sure you discuss any questions you have with your health care provider. Document Revised: 09/26/2020 Document Reviewed: 09/26/2020 Elsevier Patient Education  2024 [employer].

## 2024-06-05 ENCOUNTER — Ambulatory Visit: Payer: Self-pay | Admitting: Nurse Practitioner

## 2024-06-05 ENCOUNTER — Other Ambulatory Visit: Payer: Self-pay

## 2024-06-05 DIAGNOSIS — E782 Mixed hyperlipidemia: Secondary | ICD-10-CM

## 2024-06-05 DIAGNOSIS — I10 Essential (primary) hypertension: Secondary | ICD-10-CM

## 2024-06-05 LAB — LIPID PANEL
Cholesterol, Total: 208 mg/dL — AB (ref 100–199)
HDL: 61 mg/dL
LDL CALC COMMENT:: 3.4 ratio (ref 0.0–4.4)
LDL Chol Calc (NIH): 134 mg/dL — AB (ref 0–99)
Triglycerides: 75 mg/dL (ref 0–149)
VLDL Cholesterol Cal: 13 mg/dL (ref 5–40)

## 2024-06-05 LAB — CMP14+EGFR
ALT: 16 IU/L (ref 0–32)
AST: 16 IU/L (ref 0–40)
Albumin: 4.1 g/dL (ref 3.9–4.9)
Alkaline Phosphatase: 96 IU/L (ref 49–135)
BUN/Creatinine Ratio: 20 (ref 12–28)
BUN: 14 mg/dL (ref 8–27)
Bilirubin Total: 0.7 mg/dL (ref 0.0–1.2)
CO2: 24 mmol/L (ref 20–29)
Calcium: 9.5 mg/dL (ref 8.7–10.3)
Chloride: 104 mmol/L (ref 96–106)
Creatinine, Ser: 0.71 mg/dL (ref 0.57–1.00)
Globulin, Total: 2 g/dL (ref 1.5–4.5)
Glucose: 90 mg/dL (ref 70–99)
Potassium: 4.3 mmol/L (ref 3.5–5.2)
Sodium: 141 mmol/L (ref 134–144)
Total Protein: 6.1 g/dL (ref 6.0–8.5)
eGFR: 94 mL/min/1.73

## 2024-06-05 LAB — CBC WITH DIFFERENTIAL/PLATELET
Basophils Absolute: 0 x10E3/uL (ref 0.0–0.2)
Basos: 1 %
EOS (ABSOLUTE): 0.1 x10E3/uL (ref 0.0–0.4)
Eos: 2 %
Hematocrit: 44.6 % (ref 34.0–46.6)
Hemoglobin: 14.2 g/dL (ref 11.1–15.9)
Immature Grans (Abs): 0 x10E3/uL (ref 0.0–0.1)
Immature Granulocytes: 0 %
Lymphocytes Absolute: 0.9 x10E3/uL (ref 0.7–3.1)
Lymphs: 17 %
MCH: 29.2 pg (ref 26.6–33.0)
MCHC: 31.8 g/dL (ref 31.5–35.7)
MCV: 92 fL (ref 79–97)
Monocytes Absolute: 0.5 x10E3/uL (ref 0.1–0.9)
Monocytes: 8 %
Neutrophils Absolute: 4 x10E3/uL (ref 1.4–7.0)
Neutrophils: 72 %
Platelets: 266 x10E3/uL (ref 150–450)
RBC: 4.86 x10E6/uL (ref 3.77–5.28)
RDW: 12.4 % (ref 11.7–15.4)
WBC: 5.5 x10E3/uL (ref 3.4–10.8)

## 2024-06-05 MED ORDER — LISINOPRIL 10 MG PO TABS: 10.0000 mg | ORAL_TABLET | Freq: Every day | ORAL | 1 refills | Status: DC

## 2024-06-05 MED ORDER — PANTOPRAZOLE SODIUM 40 MG PO TBEC: 40.0000 mg | DELAYED_RELEASE_TABLET | Freq: Every day | ORAL | 1 refills | Status: DC

## 2024-06-05 MED ORDER — PANTOPRAZOLE SODIUM 40 MG PO TBEC: 40.0000 mg | DELAYED_RELEASE_TABLET | Freq: Every day | ORAL | 1 refills | Status: AC

## 2024-06-05 MED ORDER — ATORVASTATIN CALCIUM 40 MG PO TABS: 40.0000 mg | ORAL_TABLET | Freq: Every day | ORAL | 3 refills | Status: AC

## 2024-07-01 ENCOUNTER — Other Ambulatory Visit: Payer: Self-pay | Admitting: Nurse Practitioner

## 2024-07-01 DIAGNOSIS — I10 Essential (primary) hypertension: Secondary | ICD-10-CM

## 2024-11-26 ENCOUNTER — Ambulatory Visit: Admitting: Nurse Practitioner
